# Patient Record
Sex: Male | Born: 1975 | Race: Black or African American | Hispanic: No | Marital: Single | Smoking: Never smoker
Health system: Southern US, Community
[De-identification: ages and names within clinical notes are randomized; demographics above are authoritative.]

## PROBLEM LIST (undated history)

## (undated) DIAGNOSIS — M545 Low back pain, unspecified: Secondary | ICD-10-CM

## (undated) DIAGNOSIS — G4730 Sleep apnea, unspecified: Secondary | ICD-10-CM

## (undated) DIAGNOSIS — R7303 Prediabetes: Secondary | ICD-10-CM

## (undated) DIAGNOSIS — E119 Type 2 diabetes mellitus without complications: Secondary | ICD-10-CM

## (undated) DIAGNOSIS — I10 Essential (primary) hypertension: Secondary | ICD-10-CM

## (undated) HISTORY — PX: WISDOM TOOTH EXTRACTION: SHX21

---

## 1997-11-01 ENCOUNTER — Encounter: Admission: RE | Admit: 1997-11-01 | Discharge: 1997-11-01 | Payer: Self-pay | Admitting: *Deleted

## 1998-07-14 ENCOUNTER — Encounter: Admission: RE | Admit: 1998-07-14 | Discharge: 1998-07-14 | Payer: Self-pay | Admitting: *Deleted

## 2001-03-09 ENCOUNTER — Emergency Department (HOSPITAL_COMMUNITY): Admission: EM | Admit: 2001-03-09 | Discharge: 2001-03-09 | Payer: Self-pay | Admitting: Emergency Medicine

## 2006-05-17 ENCOUNTER — Emergency Department (HOSPITAL_COMMUNITY): Admission: EM | Admit: 2006-05-17 | Discharge: 2006-05-17 | Payer: Self-pay | Admitting: Emergency Medicine

## 2006-06-30 ENCOUNTER — Encounter (HOSPITAL_COMMUNITY): Admission: RE | Admit: 2006-06-30 | Discharge: 2006-09-15 | Payer: Self-pay | Admitting: Cardiology

## 2014-05-16 ENCOUNTER — Ambulatory Visit: Payer: Self-pay | Admitting: Dietician

## 2019-05-02 ENCOUNTER — Encounter (HOSPITAL_COMMUNITY): Payer: Self-pay

## 2019-05-02 ENCOUNTER — Other Ambulatory Visit: Payer: Self-pay

## 2019-05-02 ENCOUNTER — Ambulatory Visit (HOSPITAL_COMMUNITY)
Admission: EM | Admit: 2019-05-02 | Discharge: 2019-05-02 | Disposition: A | Discharge status: Home / Self Care | Payer: Self-pay | Attending: Family Medicine | Admitting: Family Medicine

## 2019-05-02 DIAGNOSIS — I10 Essential (primary) hypertension: Secondary | ICD-10-CM | POA: Diagnosis present

## 2019-05-02 DIAGNOSIS — R609 Edema, unspecified: Secondary | ICD-10-CM

## 2019-05-02 DIAGNOSIS — R600 Localized edema: Secondary | ICD-10-CM | POA: Diagnosis present

## 2019-05-02 HISTORY — DX: Type 2 diabetes mellitus without complications: E11.9

## 2019-05-02 HISTORY — DX: Essential (primary) hypertension: I10

## 2019-05-02 MED ORDER — METFORMIN HCL 500 MG PO TABS: 500.0000 mg | ORAL_TABLET | Freq: Two times a day (BID) | ORAL | 1 refills | Status: DC

## 2019-05-02 MED ORDER — LOSARTAN POTASSIUM-HCTZ 100-25 MG PO TABS: 1.0000 | ORAL_TABLET | Freq: Every day | ORAL | 1 refills | Status: DC

## 2019-05-02 MED ORDER — FUROSEMIDE 40 MG PO TABS: 40.0000 mg | ORAL_TABLET | Freq: Every day | ORAL | 1 refills | Status: DC

## 2019-05-02 MED ORDER — POTASSIUM CHLORIDE CRYS ER 10 MEQ PO TBCR: 10.0000 meq | EXTENDED_RELEASE_TABLET | Freq: Two times a day (BID) | ORAL | 1 refills | Status: DC

## 2019-05-02 NOTE — ED Provider Notes (Signed)
[facility]    CSN: [id number] Arrival date & time: 05/02/19  1604      History   Chief Complaint Chief Complaint  Patient presents with  . Leg Swelling  . Rapid Heart Rate    HPI Tyler Carroll is a 43 y.o. male.   Tyler Carroll is here with history of hypertension and dependent edema as well as rapid heart rate when he moves around.  He is morbidly obese.  Did have primary care physician who has retired per history.  Blood pressure had been maintained on losartan hydrochlorothiazide as well as Lasix.  He also is a diabetic and takes Metformin 500 twice daily.  HPI  Past Medical History:  Diagnosis Date  . Diabetes mellitus without complication (HCC)   . Hypertension     Patient Active Problem List   Diagnosis Date Noted  . Peripheral edema 05/02/2019  . Hypertension 05/02/2019    History reviewed. No pertinent surgical history.     Home Medications    Prior to Admission medications   Medication Sig Start Date End Date Taking? Authorizing Provider  furosemide (LASIX) 40 MG tablet Take 1 tablet (40 mg total) by mouth daily. 05/02/19   Frederica Kuster, MD  losartan-hydrochlorothiazide (HYZAAR) 100-25 MG tablet Take 1 tablet by mouth daily. 05/02/19   Frederica Kuster, MD  metFORMIN (GLUCOPHAGE) 500 MG tablet Take 1 tablet (500 mg total) by mouth 2 (two) times daily with a meal. 05/02/19   Frederica Kuster, MD  potassium chloride (KLOR-CON) 10 MEQ tablet Take 1 tablet (10 mEq total) by mouth 2 (two) times daily. 05/02/19   Frederica Kuster, MD    Family History Family History  Family history unknown: Yes    Social History Social History   Tobacco Use  . Smoking status: Never Smoker  . Smokeless tobacco: Never Used  Substance Use Topics  . Alcohol use: Not on file  . Drug use: Not on file     Allergies   Patient has no known allergies.   Review of Systems Review of Systems  Constitutional: Positive for activity change and fatigue.   Cardiovascular: Positive for palpitations and leg swelling.  All other systems reviewed and are negative.    Physical Exam Triage Vital Signs ED Triage Vitals  Enc Vitals Group     BP 05/02/19 1701 (!) 178/83     Pulse Rate 05/02/19 1701 86     Resp 05/02/19 1701 16     Temp 05/02/19 1701 98.6 F (37 C)     Temp Source 05/02/19 1701 Oral     SpO2 05/02/19 1701 96 %     Weight --      Height --      Head Circumference --      Peak Flow --      Pain Score 05/02/19 1702 0     Pain Loc --      Pain Edu? --      Excl. in GC? --    No data found.  Updated Vital Signs BP (!) 178/83 (BP Location: Right Arm)   Pulse 86   Temp 98.6 F (37 C) (Oral)   Resp 16   SpO2 96%   Visual Acuity Right Eye Distance:   Left Eye Distance:   Bilateral Distance:    Right Eye Near:   Left Eye Near:    Bilateral Near:     Physical Exam Vitals signs and nursing note reviewed.  Constitutional:  Appearance: Normal appearance. He is obese.  Cardiovascular:     Rate and Rhythm: Normal rate and regular rhythm.  Pulmonary:     Effort: Pulmonary effort is normal.     Breath sounds: Normal breath sounds.  Musculoskeletal:     Right lower leg: Edema present.     Left lower leg: Edema present.  Neurological:     General: No focal deficit present.     Mental Status: He is alert and oriented to person, place, and time.      UC Treatments / Results  Labs (all labs ordered are listed, but only abnormal results are displayed) Labs Reviewed - No data to display  EKG   Radiology No results found.  Procedures Procedures (including critical care time)  Medications Ordered in UC Medications - No data to display  Initial Impression / Assessment and Plan / UC Course  I have reviewed the triage vital signs and the nursing notes.  Pertinent labs & imaging results that were available during my care of the patient were reviewed by me and considered in my medical decision making (see  chart for details).     Hypertension.  Dependent edema.  Refilled medicines that he had previously been on but out of for 1 week and encouraged contact with primary care physician Final Clinical Impressions(s) / UC Diagnoses   Final diagnoses:  Peripheral edema  Hypertension, unspecified type   Discharge Instructions   None    ED Prescriptions    Medication Sig Dispense Auth. Provider   furosemide (LASIX) 40 MG tablet Take 1 tablet (40 mg total) by mouth daily. 30 tablet Wardell Honour, MD   losartan-hydrochlorothiazide (HYZAAR) 100-25 MG tablet Take 1 tablet by mouth daily. 31 tablet Wardell Honour, MD   metFORMIN (GLUCOPHAGE) 500 MG tablet Take 1 tablet (500 mg total) by mouth 2 (two) times daily with a meal. 60 tablet Wardell Honour, MD   potassium chloride (KLOR-CON) 10 MEQ tablet Take 1 tablet (10 mEq total) by mouth 2 (two) times daily. 30 tablet Wardell Honour, MD     PDMP not reviewed this encounter.   Wardell Honour, MD 05/02/19 [id number]

## 2019-05-02 NOTE — ED Triage Notes (Signed)
Pt states he ran out of blood pressure medication a week ago and needs refills.

## 2019-05-02 NOTE — ED Triage Notes (Signed)
Pt presents with bilateral leg swelling with no pain X 2 days; pt also has rapid heart rate with exertion.

## 2019-05-02 NOTE — ED Notes (Signed)
Patient assessed in lobby, HR 103 with ambulation, decreased to 93 after sitting. Pt in NAD at this time, will reassess during triage when it is the pt's turn.

## 2019-07-10 ENCOUNTER — Other Ambulatory Visit: Payer: Self-pay

## 2019-07-10 ENCOUNTER — Ambulatory Visit (HOSPITAL_COMMUNITY)
Admission: EM | Admit: 2019-07-10 | Discharge: 2019-07-10 | Disposition: A | Discharge status: Home / Self Care | Payer: Self-pay | Attending: Emergency Medicine | Admitting: Emergency Medicine

## 2019-07-10 ENCOUNTER — Encounter (HOSPITAL_COMMUNITY): Payer: Self-pay | Admitting: Emergency Medicine

## 2019-07-10 DIAGNOSIS — E119 Type 2 diabetes mellitus without complications: Secondary | ICD-10-CM | POA: Insufficient documentation

## 2019-07-10 DIAGNOSIS — I10 Essential (primary) hypertension: Secondary | ICD-10-CM | POA: Insufficient documentation

## 2019-07-10 DIAGNOSIS — R609 Edema, unspecified: Secondary | ICD-10-CM | POA: Insufficient documentation

## 2019-07-10 LAB — BASIC METABOLIC PANEL
Anion gap: 9 (ref 5–15)
BUN: 11 mg/dL (ref 6–20)
CO2: 27 mmol/L (ref 22–32)
Calcium: 9.2 mg/dL (ref 8.9–10.3)
Chloride: 106 mmol/L (ref 98–111)
Creatinine, Ser: 0.87 mg/dL (ref 0.61–1.24)
GFR calc Af Amer: >60 mL/min (ref >60)
GFR calc non Af Amer: >60 mL/min (ref >60)
Glucose, Bld: 97 mg/dL (ref 70–99)
Potassium: 3.4 mmol/L — ABNORMAL LOW (ref 3.5–5.1)
Sodium: 142 mmol/L (ref 135–145)

## 2019-07-10 MED ORDER — FUROSEMIDE 40 MG PO TABS: 40.0000 mg | ORAL_TABLET | Freq: Every day | ORAL | 0 refills | Status: DC

## 2019-07-10 MED ORDER — LOSARTAN POTASSIUM-HCTZ 100-25 MG PO TABS: 1.0000 | ORAL_TABLET | Freq: Every day | ORAL | 0 refills | Status: DC

## 2019-07-10 MED ORDER — IBUPROFEN 800 MG PO TABS: 800.0000 mg | ORAL_TABLET | Freq: Three times a day (TID) | ORAL | 0 refills | Status: DC | PRN

## 2019-07-10 MED ORDER — POTASSIUM CHLORIDE CRYS ER 10 MEQ PO TBCR: 10.0000 meq | EXTENDED_RELEASE_TABLET | Freq: Two times a day (BID) | ORAL | 0 refills | Status: DC

## 2019-07-10 MED ORDER — METFORMIN HCL 500 MG PO TABS: 500.0000 mg | ORAL_TABLET | Freq: Two times a day (BID) | ORAL | 0 refills | Status: DC

## 2019-07-10 NOTE — ED Triage Notes (Signed)
Pt states his PCP retired, states he does not have insurance and its been difficult to get in to see someone. Has been taking his maintenance medicines. Pt c/o R leg swelling off and on. Also requesting medication refills. Denies pain

## 2019-07-10 NOTE — Discharge Instructions (Signed)
I have provided you with 30 days of refills of your medications.  Please use compression stockings to help with your swelling, elevate your legs as able.  I will call you if there are any concerning findings with your labs.  Please establish with a primary care provider for long term management of your medications. I have provided you with options which will work with your financial situation so you can still be managed.

## 2019-07-11 NOTE — ED Provider Notes (Signed)
[facility]    CSN: [id number] Arrival date & time: 07/10/19  1449      History   Chief Complaint Chief Complaint  Patient presents with  . Leg Swelling  . Medication Refill    HPI Tyler Carroll is a 44 y.o. male.   Tyler Carroll presents with complaints of bilateral leg swelling, R>L, with requests for refills of his medications. His physician retired and so he has not yet established with a new PCP. He took his medications yesterday, last. He takes lasix daily which does help with his swelling. Swelling isn't necessarily worse than usual, without new pain, redness or warmth. No calf pain. No new cough, shortness of breath , orthopnea or chest pain . Uses a CPAP. Out of his lasix, metformin, hyzaar, potassium. Also states he intermittently uses ibuprofen for pain, feels that ibuprofen is most helpful with this and would like a refill of this as well.     ROS per HPI, negative if not otherwise mentioned.      Past Medical History:  Diagnosis Date  . Diabetes mellitus without complication (HCC)   . Hypertension     Patient Active Problem List   Diagnosis Date Noted  . Peripheral edema 05/02/2019  . Hypertension 05/02/2019    History reviewed. No pertinent surgical history.     Home Medications    Prior to Admission medications   Medication Sig Start Date End Date Taking? Authorizing Provider  furosemide (LASIX) 40 MG tablet Take 1 tablet (40 mg total) by mouth daily. 07/10/19 08/09/19  Georgetta Haber, NP  ibuprofen (ADVIL) 800 MG tablet Take 1 tablet (800 mg total) by mouth every 8 (eight) hours as needed. 07/10/19   Georgetta Haber, NP  losartan-hydrochlorothiazide (HYZAAR) 100-25 MG tablet Take 1 tablet by mouth daily. 07/10/19   Georgetta Haber, NP  metFORMIN (GLUCOPHAGE) 500 MG tablet Take 1 tablet (500 mg total) by mouth 2 (two) times daily with a meal. 07/10/19   Zacherie Honeyman, Barron Alvine, NP  potassium chloride (KLOR-CON) 10 MEQ tablet Take 1 tablet (10  mEq total) by mouth 2 (two) times daily. 07/10/19   Georgetta Haber, NP    Family History Family History  Family history unknown: Yes    Social History Social History   Tobacco Use  . Smoking status: Never Smoker  . Smokeless tobacco: Never Used  Substance Use Topics  . Alcohol use: Not on file  . Drug use: Not on file     Allergies   Patient has no known allergies.   Review of Systems Review of Systems   Physical Exam Triage Vital Signs ED Triage Vitals  Enc Vitals Group     BP 07/10/19 1555 (!) 173/90     Pulse Rate 07/10/19 1555 91     Resp 07/10/19 1555 18     Temp 07/10/19 1555 99 F (37.2 C)     Temp Source 07/10/19 1555 Oral     SpO2 07/10/19 1555 95 %     Weight --      Height --      Head Circumference --      Peak Flow --      Pain Score 07/10/19 1558 0     Pain Loc --      Pain Edu? --      Excl. in GC? --    No data found.  Updated Vital Signs BP (!) 173/90 (BP Location: Left Arm)   Pulse 91  Temp 99 F (37.2 C) (Oral)   Resp 18   SpO2 95%    Physical Exam Constitutional:      Appearance: He is well-developed.  Cardiovascular:     Rate and Rhythm: Normal rate.     Comments: No redness, warmth or tenderness to lower legs with mild increased swelling to RLE  Pulmonary:     Effort: Pulmonary effort is normal.  Musculoskeletal:     Right lower leg: 2+ Edema present.     Left lower leg: 1+ Edema present.  Skin:    General: Skin is warm and dry.  Neurological:     Mental Status: He is alert and oriented to person, place, and time.      UC Treatments / Results  Labs (all labs ordered are listed, but only abnormal results are displayed) Labs Reviewed  BASIC METABOLIC PANEL - Abnormal; Notable for the following components:      Result Value   Potassium 3.4 (*)    All other components within normal limits    EKG   Radiology No results found.  Procedures Procedures (including critical care time)  Medications Ordered in  UC Medications - No data to display  Initial Impression / Assessment and Plan / UC Course  I have reviewed the triage vital signs and the nursing notes.  Pertinent labs & imaging results that were available during my care of the patient were reviewed by me and considered in my medical decision making (see chart for details).     Medications refilled today, hypertension and lower extremity edema noted, meds taken last yesterday. Potassium refilled with noted K of 3.4 today. To continue with daily potassium. Emphasized establish with a PCP for recheck and monitoring of his labs and status. Return precautions provided. Ambulatory out of clinic without difficulty.    Final Clinical Impressions(s) / UC Diagnoses   Final diagnoses:  Peripheral edema  Hypertension, unspecified type  Type 2 diabetes mellitus without complication, without long-term current use of insulin ([place])     Discharge Instructions     I have provided you with 30 days of refills of your medications.  Please use compression stockings to help with your swelling, elevate your legs as able.  I will call you if there are any concerning findings with your labs.  Please establish with a primary care provider for long term management of your medications. I have provided you with options which will work with your financial situation so you can still be managed.    ED Prescriptions    Medication Sig Dispense Auth. Provider   furosemide (LASIX) 40 MG tablet Take 1 tablet (40 mg total) by mouth daily. 30 tablet Augusto Gamble B, NP   losartan-hydrochlorothiazide (HYZAAR) 100-25 MG tablet Take 1 tablet by mouth daily. 30 tablet Augusto Gamble B, NP   metFORMIN (GLUCOPHAGE) 500 MG tablet Take 1 tablet (500 mg total) by mouth 2 (two) times daily with a meal. 60 tablet Kirt Chew B, NP   potassium chloride (KLOR-CON) 10 MEQ tablet Take 1 tablet (10 mEq total) by mouth 2 (two) times daily. 60 tablet Augusto Gamble B, NP   ibuprofen  (ADVIL) 800 MG tablet Take 1 tablet (800 mg total) by mouth every 8 (eight) hours as needed. 21 tablet Zigmund Gottron, NP     PDMP not reviewed this encounter.   Zigmund Gottron, NP 07/11/19 1104

## 2019-07-15 ENCOUNTER — Telehealth (HOSPITAL_COMMUNITY): Payer: Self-pay | Admitting: Emergency Medicine

## 2019-07-15 NOTE — Telephone Encounter (Signed)
Attempted to reach pt to discuss labs and medicine, no answer.

## 2019-09-03 ENCOUNTER — Encounter (HOSPITAL_COMMUNITY): Payer: Self-pay

## 2019-09-03 ENCOUNTER — Other Ambulatory Visit: Payer: Self-pay

## 2019-09-03 ENCOUNTER — Ambulatory Visit (HOSPITAL_COMMUNITY)
Admission: EM | Admit: 2019-09-03 | Discharge: 2019-09-03 | Disposition: A | Discharge status: Home / Self Care | Payer: Medicaid Other

## 2019-09-03 DIAGNOSIS — S61212A Laceration without foreign body of right middle finger without damage to nail, initial encounter: Secondary | ICD-10-CM | POA: Insufficient documentation

## 2019-09-03 DIAGNOSIS — Z760 Encounter for issue of repeat prescription: Secondary | ICD-10-CM | POA: Insufficient documentation

## 2019-09-03 DIAGNOSIS — L03011 Cellulitis of right finger: Secondary | ICD-10-CM | POA: Insufficient documentation

## 2019-09-03 DIAGNOSIS — I10 Essential (primary) hypertension: Secondary | ICD-10-CM | POA: Insufficient documentation

## 2019-09-03 LAB — BASIC METABOLIC PANEL
Anion gap: 10 (ref 5–15)
BUN: 9 mg/dL (ref 6–20)
CO2: 25 mmol/L (ref 22–32)
Calcium: 9 mg/dL (ref 8.9–10.3)
Chloride: 103 mmol/L (ref 98–111)
Creatinine, Ser: 0.79 mg/dL (ref 0.61–1.24)
GFR calc Af Amer: >60 mL/min (ref >60)
GFR calc non Af Amer: >60 mL/min (ref >60)
Glucose, Bld: 99 mg/dL (ref 70–99)
Potassium: 4.1 mmol/L (ref 3.5–5.1)
Sodium: 138 mmol/L (ref 135–145)

## 2019-09-03 MED ORDER — CEPHALEXIN 500 MG PO CAPS: 500.0000 mg | ORAL_CAPSULE | Freq: Four times a day (QID) | ORAL | 0 refills | Status: AC

## 2019-09-03 MED ORDER — FUROSEMIDE 40 MG PO TABS: 40.0000 mg | ORAL_TABLET | Freq: Every day | ORAL | 0 refills | Status: DC

## 2019-09-03 MED ORDER — METFORMIN HCL 500 MG PO TABS: 500.0000 mg | ORAL_TABLET | Freq: Two times a day (BID) | ORAL | 0 refills | Status: DC

## 2019-09-03 MED ORDER — LOSARTAN POTASSIUM-HCTZ 100-25 MG PO TABS: 1.0000 | ORAL_TABLET | Freq: Every day | ORAL | 0 refills | Status: DC

## 2019-09-03 MED ORDER — POTASSIUM CHLORIDE CRYS ER 10 MEQ PO TBCR: 10.0000 meq | EXTENDED_RELEASE_TABLET | Freq: Two times a day (BID) | ORAL | 0 refills | Status: DC

## 2019-09-03 NOTE — ED Provider Notes (Signed)
[place]    CSN: [id number] Arrival date & time: 09/03/19  1133      History   Chief Complaint Chief Complaint  Patient presents with  . Laceration  . Medication Refill    HPI Deloris Mittag is a 44 y.o. male history of hypertension, DM type II, peripheral edema presenting today for evaluation of laceration to finger as well as medication refill.  Patient sustained laceration to his right middle finger around the nailbed approximately 3 days ago.  Occurred while trying to use a caulk gun.  He is concerned as of recently he has noticed an area turning white and he is concerned about infection.  Has had increased pain of recently as well.  Patient also is requesting refills of losartan, Lasix, Metformin and potassium.  Has 1 day left of medicines.  Has plans to follow-up with PCP later this month, is currently in between jobs and is hoping to regain health insurance with his new job.  HPI  Past Medical History:  Diagnosis Date  . Diabetes mellitus without complication ([place])   . Hypertension     Patient Active Problem List   Diagnosis Date Noted  . Peripheral edema 05/02/2019  . Hypertension 05/02/2019    History reviewed. No pertinent surgical history.     Home Medications    Prior to Admission medications   Medication Sig Start Date End Date Taking? Authorizing Provider  Blood Pressure Monitoring (BLOOD PRESSURE KIT) KIT Check BP as needed. 09/18/18  Yes [provider]  rosuvastatin (CRESTOR) 10 MG tablet Take by mouth. 12/27/18  Yes [provider]  potassium chloride (KLOR-CON) 10 MEQ tablet Take by mouth. 12/27/18 09/03/19 Yes [provider]  cephALEXin (KEFLEX) 500 MG capsule Take 1 capsule (500 mg total) by mouth 4 (four) times daily for 7 days. 09/03/19 09/10/19  Tyreek Clabo C, PA-C  furosemide (LASIX) 40 MG tablet Take 1 tablet (40 mg total) by mouth daily. 09/03/19 10/03/19  Gino Garrabrant C, PA-C  ibuprofen (ADVIL) 800 MG  tablet Take 1 tablet (800 mg total) by mouth every 8 (eight) hours as needed. 07/10/19   Zigmund Gottron, NP  losartan-hydrochlorothiazide (HYZAAR) 100-25 MG tablet Take 1 tablet by mouth daily. 09/03/19   Orissa Arreaga C, PA-C  metFORMIN (GLUCOPHAGE) 500 MG tablet Take 1 tablet (500 mg total) by mouth 2 (two) times daily with a meal. 09/03/19   Emileo Semel C, PA-C  potassium chloride (KLOR-CON) 10 MEQ tablet Take 1 tablet (10 mEq total) by mouth 2 (two) times daily. 09/03/19   Kairee Isa, Elesa Hacker, PA-C    Family History Family History  Problem Relation Age of Onset  . Hypertension Mother   . Heart failure Father     Social History Social History   Tobacco Use  . Smoking status: Never Smoker  . Smokeless tobacco: Never Used  Substance Use Topics  . Alcohol use: Not Currently  . Drug use: Never     Allergies   Patient has no known allergies.   Review of Systems Review of Systems  Constitutional: Negative for fatigue and fever.  HENT: Negative for congestion, sinus pressure and sore throat.   Eyes: Negative for photophobia, pain, redness, itching and visual disturbance.  Respiratory: Negative for cough and shortness of breath.   Cardiovascular: Negative for chest pain.  Gastrointestinal: Negative for abdominal pain, nausea and vomiting.  Genitourinary: Negative for decreased urine volume and hematuria.  Musculoskeletal: Positive for arthralgias. Negative for myalgias, neck pain and  neck stiffness.  Skin: Positive for color change and wound. Negative for rash.  Neurological: Negative for dizziness, syncope, speech difficulty, weakness, light-headedness, numbness and headaches.     Physical Exam Triage Vital Signs ED Triage Vitals  Enc Vitals Group     BP 09/03/19 1229 (!) 162/72     Pulse Rate 09/03/19 1229 86     Resp 09/03/19 1229 (!) 21     Temp 09/03/19 1229 98.5 F (36.9 C)     Temp Source 09/03/19 1229 Oral     SpO2 09/03/19 1229 100 %     Weight 09/03/19 1226  (!) 425 lb (192.8 kg)     Height --      Head Circumference --      Peak Flow --      Pain Score 09/03/19 1226 6     Pain Loc --      Pain Edu? --      Excl. in [place]? --    No data found.  Updated Vital Signs BP (!) 162/72 (BP Location: Right Arm)   Pulse 86   Temp 98.5 F (36.9 C) (Oral)   Resp (!) 21   Wt (!) 425 lb (192.8 kg)   SpO2 100%   Visual Acuity Right Eye Distance:   Left Eye Distance:   Bilateral Distance:    Right Eye Near:   Left Eye Near:    Bilateral Near:     Physical Exam Vitals and nursing note reviewed.  Constitutional:      Appearance: He is well-developed.     Comments: No acute distress  HENT:     Head: Normocephalic and atraumatic.     Nose: Nose normal.  Eyes:     Extraocular Movements: Extraocular movements intact.     Conjunctiva/sclera: Conjunctivae normal.     Pupils: Pupils are equal, round, and reactive to light.  Cardiovascular:     Rate and Rhythm: Normal rate.  Pulmonary:     Effort: Pulmonary effort is normal. No respiratory distress.     Comments:   Abdominal:     General: There is no distension.  Musculoskeletal:        General: Normal range of motion.     Cervical back: Neck supple.  Skin:    General: Skin is warm and dry.     Comments: Right middle finger with superficial abrasion noted to lateral nail fold, small area of white discoloration, tender to palpation over this area, does not extend distal finger pulp  Neurological:     Mental Status: He is alert and oriented to person, place, and time.      UC Treatments / Results  Labs (all labs ordered are listed, but only abnormal results are displayed) Labs Reviewed  BASIC METABOLIC PANEL    EKG   Radiology No results found.  Procedures Procedures (including critical care time)  Attempted I&D per patient request.  Cleaned area with Betadine and allowed to dry.  Inserted 11 blade scalpel into area of white discoloration, no drainage obtained.  Wound  wrapped with bandage.  No immediate complications.  Medications Ordered in UC Medications - No data to display  Initial Impression / Assessment and Plan / UC Course  I have reviewed the triage vital signs and the nursing notes.  Pertinent labs & imaging results that were available during my care of the patient were reviewed by me and considered in my medical decision making (see chart for details).     Superficial  abrasion, will go ahead and empirically place on Keflex and continue to monitor for gradual healing.  Keep clean and dry.  Refilling blood pressure medicine, Lasix, Metformin and potassium.  Has not had recent blood work, will recheck BMP to ensure potassium and kidney function within normal limits.  Follow-up with PCP for further refills and management of blood pressure/diabetes/edema.  Discussed strict return precautions. Patient verbalized understanding and is agreeable with plan.  Final Clinical Impressions(s) / UC Diagnoses   Final diagnoses:  Laceration of right middle finger without foreign body without damage to nail, initial encounter  Paronychia of right middle finger  Medication refill  Essential hypertension     Discharge Instructions     Please continue to keep wound clean and dry, begin Keflex 4 times daily for the next week Ensure wound gradually healing and pain improving over the next week  I have refilled your Hyzaar, Lasix, Metformin and potassium.  Continue to take as prescribed. I am checking your electrolytes and kidney function, I will call if anything looks concerning in regards to continuing your medicines Please continue to establish care with primary care for further refills and management of hypertension, diabetes   ED Prescriptions    Medication Sig Dispense Auth. Provider   cephALEXin (KEFLEX) 500 MG capsule Take 1 capsule (500 mg total) by mouth 4 (four) times daily for 7 days. 28 capsule Palmira Stickle C, PA-C   furosemide (LASIX) 40  MG tablet Take 1 tablet (40 mg total) by mouth daily. 30 tablet Kurtiss Wence C, PA-C   losartan-hydrochlorothiazide (HYZAAR) 100-25 MG tablet Take 1 tablet by mouth daily. 30 tablet Kamelia Lampkins C, PA-C   metFORMIN (GLUCOPHAGE) 500 MG tablet Take 1 tablet (500 mg total) by mouth 2 (two) times daily with a meal. 60 tablet Rechelle Niebla C, PA-C   potassium chloride (KLOR-CON) 10 MEQ tablet Take 1 tablet (10 mEq total) by mouth 2 (two) times daily. 60 tablet Nykeem Citro, [place] C, PA-C     PDMP not reviewed this encounter.   Janith Lima, [place] 09/03/19 1412

## 2019-09-03 NOTE — Discharge Instructions (Signed)
Please continue to keep wound clean and dry, begin Keflex 4 times daily for the next week Ensure wound gradually healing and pain improving over the next week  I have refilled your Hyzaar, Lasix, Metformin and potassium.  Continue to take as prescribed. I am checking your electrolytes and kidney function, I will call if anything looks concerning in regards to continuing your medicines Please continue to establish care with primary care for further refills and management of hypertension, diabetes

## 2019-09-03 NOTE — ED Triage Notes (Signed)
Pt is here needing a medication refill of Losartan 100-25MG , Lasix 40MG , Metformin 500MG , Klor-Con-10MEG he has 1 day left. Pt also has a right hand finger #3(middle) laceration that happened 3 days ago.

## 2019-10-24 ENCOUNTER — Ambulatory Visit (HOSPITAL_COMMUNITY)
Admission: EM | Admit: 2019-10-24 | Discharge: 2019-10-24 | Disposition: A | Discharge status: Home / Self Care | Payer: Medicaid Other | Attending: Family Medicine | Admitting: Family Medicine

## 2019-10-24 ENCOUNTER — Other Ambulatory Visit: Payer: Self-pay

## 2019-10-24 ENCOUNTER — Encounter (HOSPITAL_COMMUNITY): Payer: Self-pay

## 2019-10-24 DIAGNOSIS — Z760 Encounter for issue of repeat prescription: Secondary | ICD-10-CM

## 2019-10-24 DIAGNOSIS — L89511 Pressure ulcer of right ankle, stage 1: Secondary | ICD-10-CM

## 2019-10-24 DIAGNOSIS — R600 Localized edema: Secondary | ICD-10-CM

## 2019-10-24 MED ORDER — POTASSIUM CHLORIDE CRYS ER 10 MEQ PO TBCR: 10.0000 meq | EXTENDED_RELEASE_TABLET | Freq: Two times a day (BID) | ORAL | 1 refills | Status: DC

## 2019-10-24 MED ORDER — METFORMIN HCL 500 MG PO TABS: 500.0000 mg | ORAL_TABLET | Freq: Two times a day (BID) | ORAL | 1 refills | Status: DC

## 2019-10-24 MED ORDER — BACITRACIN ZINC 500 UNIT/GM EX OINT
TOPICAL_OINTMENT | CUTANEOUS | Status: AC
  Filled 2019-10-24: qty 28.35

## 2019-10-24 MED ORDER — FUROSEMIDE 40 MG PO TABS: 40.0000 mg | ORAL_TABLET | Freq: Every day | ORAL | 1 refills | Status: DC

## 2019-10-24 MED ORDER — LOSARTAN POTASSIUM-HCTZ 100-25 MG PO TABS: 1.0000 | ORAL_TABLET | Freq: Every day | ORAL | 1 refills | Status: DC

## 2019-10-24 MED ORDER — ROSUVASTATIN CALCIUM 10 MG PO TABS: 10.0000 mg | ORAL_TABLET | Freq: Every day | ORAL | 1 refills | Status: DC

## 2019-10-24 MED ORDER — MUPIROCIN 2 % EX OINT: TOPICAL_OINTMENT | CUTANEOUS | 0 refills | Status: DC

## 2019-10-24 MED ORDER — BACITRACIN ZINC 500 UNIT/GM EX OINT
TOPICAL_OINTMENT | CUTANEOUS | Status: AC
Start: 2019-10-24 — End: ?
  Filled 2019-10-24: qty 0.9

## 2019-10-24 NOTE — ED Triage Notes (Signed)
Pt presents with sore on right ankle that he believes is not healing properly.

## 2019-10-24 NOTE — ED Provider Notes (Signed)
[place]    CSN: [id number] Arrival date & time: 10/24/19  1742      History   Chief Complaint Chief Complaint  Patient presents with  . Sore on Ankle    HPI Tyler Carroll is a 44 y.o. male.   HPI  Patient has problems with chronic pedal edema He takes a diuretic.  He elevates his feet.  He watches his salt.  He tries to wear compression stockings. He states that he had some rubbing on the front of his ankle that caused a blister and now a small ulcer.  The wound is been present for about a week.  He would like to have this looked at. He does not have diabetes.  He is on Metformin for "prediabetes". He does have hypertension.  He states he is compliant with his medicine.  His blood pressure is mildly elevated today. Patient is morbidly obese He does have a primary care doctor but his appointment is not until June or July.  He needs refills of his medicines until he can be seen  Past Medical History:  Diagnosis Date  . Diabetes mellitus without complication ([place])   . Hypertension     Patient Active Problem List   Diagnosis Date Noted  . Peripheral edema 05/02/2019  . Hypertension 05/02/2019    History reviewed. No pertinent surgical history.     Home Medications    Prior to Admission medications   Medication Sig Start Date End Date Taking? Authorizing Provider  Blood Pressure Monitoring (BLOOD PRESSURE KIT) KIT Check BP as needed. 09/18/18   [provider]  furosemide (LASIX) 40 MG tablet Take 1 tablet (40 mg total) by mouth daily. 10/24/19 11/23/19  Raylene Everts, MD  ibuprofen (ADVIL) 800 MG tablet Take 1 tablet (800 mg total) by mouth every 8 (eight) hours as needed. 07/10/19   Zigmund Gottron, NP  losartan-hydrochlorothiazide (HYZAAR) 100-25 MG tablet Take 1 tablet by mouth daily. 10/24/19   Raylene Everts, MD  metFORMIN (GLUCOPHAGE) 500 MG tablet Take 1 tablet (500 mg total) by mouth 2 (two) times daily with a meal. 10/24/19    Raylene Everts, MD  mupirocin ointment Drue Stager) 2 % Apply to rash two times a day 10/24/19   Raylene Everts, MD  potassium chloride (KLOR-CON) 10 MEQ tablet Take 1 tablet (10 mEq total) by mouth 2 (two) times daily. 10/24/19   Raylene Everts, MD  rosuvastatin (CRESTOR) 10 MG tablet Take 1 tablet (10 mg total) by mouth daily. 10/24/19   Raylene Everts, MD  potassium chloride (KLOR-CON) 10 MEQ tablet Take by mouth. 12/27/18 09/03/19  [provider]    Family History Family History  Problem Relation Age of Onset  . Hypertension Mother   . Heart failure Father     Social History Social History   Tobacco Use  . Smoking status: Never Smoker  . Smokeless tobacco: Never Used  Substance Use Topics  . Alcohol use: Not Currently  . Drug use: Never     Allergies   Patient has no known allergies.   Review of Systems Review of Systems  Cardiovascular: Positive for leg swelling.  Skin: Positive for wound.     Physical Exam Triage Vital Signs ED Triage Vitals  Enc Vitals Group     BP 10/24/19 1841 (!) 158/94     Pulse Rate 10/24/19 1841 80     Resp 10/24/19 1841 18     Temp 10/24/19 1841 98.4  F (36.9 C)     Temp Source 10/24/19 1841 Oral     SpO2 10/24/19 1841 100 %     Weight --      Height --      Head Circumference --      Peak Flow --      Pain Score 10/24/19 1839 3     Pain Loc --      Pain Edu? --      Excl. in [place]? --    No data found.  Updated Vital Signs BP (!) 158/94 (BP Location: Right Arm)   Pulse 80   Temp 98.4 F (36.9 C) (Oral)   Resp 18   SpO2 100%      Physical Exam Constitutional:      General: He is not in acute distress.    Appearance: He is well-developed. He is obese.     Comments: Super obese  HENT:     Head: Normocephalic and atraumatic.     Mouth/Throat:     Comments: Mask is in place Eyes:     Conjunctiva/sclera: Conjunctivae normal.     Pupils: Pupils are equal, round, and reactive to light.    Cardiovascular:     Rate and Rhythm: Normal rate.  Pulmonary:     Effort: Pulmonary effort is normal. No respiratory distress.  Musculoskeletal:        General: Normal range of motion.     Cervical back: Normal range of motion.     Right lower leg: Edema present.     Left lower leg: Edema present.     Comments: Large pitting edema both ankles to knee  Skin:    General: Skin is warm and dry.     Comments: Anterior right ankle has a 2 cm oval-shaped superficial ulceration.  Partial-thickness.  No erythema or drainage to suggest infection  Neurological:     General: No focal deficit present.     Mental Status: He is alert.  Psychiatric:        Mood and Affect: Mood normal.        Behavior: Behavior normal.      UC Treatments / Results  Labs (all labs ordered are listed, but only abnormal results are displayed) Labs Reviewed - No data to display  EKG   Radiology No results found.  Procedures Procedures (including critical care time)  Medications Ordered in UC Medications - No data to display  Initial Impression / Assessment and Plan / UC Course  I have reviewed the triage vital signs and the nursing notes.  Pertinent labs & imaging results that were available during my care of the patient were reviewed by me and considered in my medical decision making (see chart for details).     Reviewed that these wounds take a long time to heal.  Managing edema as part of the process.  He needs to really try to get the swelling down in order for the wound to heal.  Also needs to be careful about his carbohydrates and taking the Metformin so his sugar does not get high.  I told him that diabetes will also impair wound healing.  He is not a cigarette smoker.  Discussed signs of infection.  Discussed wound care Final Clinical Impressions(s) / UC Diagnoses   Final diagnoses:  Pedal edema  Pressure injury of right ankle, stage 1  Encounter for medication refill     Discharge  Instructions     Clean area 2 times a  day and apply small amount of antibiotic ointment.  Cover with bandage Return as needed Medicines are refilled   ED Prescriptions    Medication Sig Dispense Auth. Provider   furosemide (LASIX) 40 MG tablet Take 1 tablet (40 mg total) by mouth daily. 30 tablet Raylene Everts, MD   losartan-hydrochlorothiazide [facility]) 100-25 MG tablet Take 1 tablet by mouth daily. 30 tablet Raylene Everts, MD   metFORMIN (GLUCOPHAGE) 500 MG tablet Take 1 tablet (500 mg total) by mouth 2 (two) times daily with a meal. 60 tablet Raylene Everts, MD   potassium chloride (KLOR-CON) 10 MEQ tablet Take 1 tablet (10 mEq total) by mouth 2 (two) times daily. 60 tablet Raylene Everts, MD   rosuvastatin (CRESTOR) 10 MG tablet Take 1 tablet (10 mg total) by mouth daily. 30 tablet Raylene Everts, MD   mupirocin ointment (BACTROBAN) 2 % Apply to rash two times a day 22 g Raylene Everts, MD     PDMP not reviewed this encounter.   Raylene Everts, MD 10/24/19 [phone]

## 2019-10-24 NOTE — Discharge Instructions (Signed)
Clean area 2 times a day and apply small amount of antibiotic ointment.  Cover with bandage Return as needed Medicines are refilled

## 2020-01-01 ENCOUNTER — Encounter (HOSPITAL_COMMUNITY): Payer: Self-pay

## 2020-01-01 ENCOUNTER — Ambulatory Visit (INDEPENDENT_AMBULATORY_CARE_PROVIDER_SITE_OTHER): Payer: Self-pay

## 2020-01-01 ENCOUNTER — Ambulatory Visit (HOSPITAL_COMMUNITY)
Admission: EM | Admit: 2020-01-01 | Discharge: 2020-01-01 | Disposition: A | Discharge status: Home / Self Care | Payer: Self-pay | Attending: Urgent Care | Admitting: Urgent Care

## 2020-01-01 ENCOUNTER — Other Ambulatory Visit: Payer: Self-pay

## 2020-01-01 DIAGNOSIS — L089 Local infection of the skin and subcutaneous tissue, unspecified: Secondary | ICD-10-CM

## 2020-01-01 DIAGNOSIS — M25442 Effusion, left hand: Secondary | ICD-10-CM

## 2020-01-01 DIAGNOSIS — T148XXA Other injury of unspecified body region, initial encounter: Secondary | ICD-10-CM

## 2020-01-01 DIAGNOSIS — M79645 Pain in left finger(s): Secondary | ICD-10-CM

## 2020-01-01 DIAGNOSIS — M25542 Pain in joints of left hand: Secondary | ICD-10-CM

## 2020-01-01 DIAGNOSIS — L03012 Cellulitis of left finger: Secondary | ICD-10-CM

## 2020-01-01 MED ORDER — FUROSEMIDE 40 MG PO TABS: 40.0000 mg | ORAL_TABLET | Freq: Every day | ORAL | 0 refills | Status: DC

## 2020-01-01 MED ORDER — KETOROLAC TROMETHAMINE 60 MG/2ML IM SOLN
60.0000 mg | Freq: Once | INTRAMUSCULAR | Status: AC
  Administered 2020-01-01: 60 mg via INTRAMUSCULAR

## 2020-01-01 MED ORDER — KETOROLAC TROMETHAMINE 60 MG/2ML IM SOLN
INTRAMUSCULAR | Status: AC
  Filled 2020-01-01: qty 2

## 2020-01-01 MED ORDER — DOXYCYCLINE HYCLATE 100 MG PO CAPS: 100.0000 mg | ORAL_CAPSULE | Freq: Two times a day (BID) | ORAL | 0 refills | Status: DC

## 2020-01-01 MED ORDER — LOSARTAN POTASSIUM 100 MG PO TABS
100.0000 mg | ORAL_TABLET | Freq: Every day | ORAL | 0 refills | Status: DC
Start: 2020-01-01 — End: 2021-10-23

## 2020-01-01 MED ORDER — NAPROXEN 500 MG PO TABS
500.0000 mg | ORAL_TABLET | Freq: Two times a day (BID) | ORAL | 0 refills | Status: DC
Start: 2020-01-01 — End: 2021-10-23

## 2020-01-01 NOTE — Progress Notes (Signed)
Orthopedic Tech Progress Note Patient Details:  Tyler Carroll 1975/07/03 [id number]  Ortho Devices Type of Ortho Device: Volar splint Ortho Device/Splint Location: Left Upper Extremity Ortho Device/Splint Interventions: Ordered, Application   Post Interventions Patient Tolerated: Well Instructions Provided: Adjustment of device, Care of device, Poper ambulation with device Applied volar splint from proximal wrist to DIP of third left digit per MD request.  Gerald Stabs 01/01/2020, 1:08 PM

## 2020-01-01 NOTE — Discharge Instructions (Signed)
Please start doxycycline today to help with your wound infection spreading into your hand.  Make sure you take naproxen for pain and inflammation.  Wear the splint of your hand until the hand specialist can see for recheck.  This is very important as this kind of infection can become severe enough that you need to have hand specialist to open up the wound to have drainage of the infection.  If you worsen over the next 48 hours, please report to the emergency room.`

## 2020-01-01 NOTE — ED Triage Notes (Signed)
Patient reports left hand swelling x3 days. Patient can't think of injury or trauma to the area.

## 2020-01-01 NOTE — ED Provider Notes (Signed)
[place]   MRN: [id number] DOB: September 21, 1975  Subjective:   Tyler Carroll is a 44 y.o. male presenting for 3 day hx of acute onset worsening left 3rd finger pain, extending into his left hand with swelling and redness.  Reports that he had had a slight trauma from trying to kill a bug, hit his head up against the wall for missing the bug.  Denies other fall or trauma.  He was doing a lot of extensive cleaning in the past few days.  Try to keep his wound covered.  Patient reports history of hypertension, is prediabetic as well.  Last blood sugar check was under 100.  No current facility-administered medications for this encounter.  Current Outpatient Medications:    Blood Pressure Monitoring (BLOOD PRESSURE KIT) KIT, Check BP as needed., Disp: , Rfl:    furosemide (LASIX) 40 MG tablet, Take 1 tablet (40 mg total) by mouth daily., Disp: 30 tablet, Rfl: 1   ibuprofen (ADVIL) 800 MG tablet, Take 1 tablet (800 mg total) by mouth every 8 (eight) hours as needed., Disp: 21 tablet, Rfl: 0   losartan-hydrochlorothiazide (HYZAAR) 100-25 MG tablet, Take 1 tablet by mouth daily., Disp: 30 tablet, Rfl: 1   metFORMIN (GLUCOPHAGE) 500 MG tablet, Take 1 tablet (500 mg total) by mouth 2 (two) times daily with a meal., Disp: 60 tablet, Rfl: 1   mupirocin ointment (BACTROBAN) 2 %, Apply to rash two times a day, Disp: 22 g, Rfl: 0   potassium chloride (KLOR-CON) 10 MEQ tablet, Take 1 tablet (10 mEq total) by mouth 2 (two) times daily., Disp: 60 tablet, Rfl: 1   rosuvastatin (CRESTOR) 10 MG tablet, Take 1 tablet (10 mg total) by mouth daily., Disp: 30 tablet, Rfl: 1   No Known Allergies  Past Medical History:  Diagnosis Date   Diabetes mellitus without complication ([place])    "pre-diabetic" 01/01/20   Hypertension      History reviewed. No pertinent surgical history.  Family History  Problem Relation Age of Onset   Hypertension Mother    Heart failure Father     Social History     Tobacco Use   Smoking status: Never Smoker   Smokeless tobacco: Never Used  Substance Use Topics   Alcohol use: Not Currently   Drug use: Never    ROS   Objective:   Vitals: BP (!) 134/67    Pulse 99    Temp 97.9 F (36.6 C)    Resp 18    SpO2 100%   Physical Exam Constitutional:      General: He is not in acute distress.    Appearance: Normal appearance. He is well-developed. He is obese. He is not ill-appearing, toxic-appearing or diaphoretic.  HENT:     Head: Normocephalic and atraumatic.     Right Ear: External ear normal.     Left Ear: External ear normal.     Nose: Nose normal.     Mouth/Throat:     Pharynx: Oropharynx is clear.  Eyes:     General: No scleral icterus.       Right eye: No discharge.        Left eye: No discharge.     Extraocular Movements: Extraocular movements intact.     Pupils: Pupils are equal, round, and reactive to light.  Cardiovascular:     Rate and Rhythm: Normal rate.  Pulmonary:     Effort: Pulmonary effort is normal.  Musculoskeletal:     Right hand: Swelling  and tenderness present. No deformity, lacerations or bony tenderness. Decreased range of motion. Normal strength. Normal sensation. There is no disruption of two-point discrimination. Normal capillary refill.       Hands:     Cervical back: Normal range of motion.  Skin:    General: Skin is warm and dry.  Neurological:     Mental Status: He is alert and oriented to person, place, and time.  Psychiatric:        Mood and Affect: Mood normal.        Behavior: Behavior normal.        Thought Content: Thought content normal.        Judgment: Judgment normal.     DG Hand Complete Left  Result Date: 01/01/2020 CLINICAL DATA:  Left hand pain and swelling for the past 3 days, primarily involving the third MCP joint. EXAM: LEFT HAND - COMPLETE 3+ VIEW COMPARISON:  None. FINDINGS: Apparent soft tissue swelling about the dorsal aspect of the hand. No fracture or dislocation  with special attention paid to the third MCP joint. No subcutaneous emphysema. No discrete areas of osteolysis to suggest osteomyelitis. There is a peripherally corticated ossicle adjacent to the base of the first metacarpal. Joint spaces are preserved. No erosions. IMPRESSION: Soft tissue swelling about the dorsal aspect of the hand without associated fracture or radiopaque foreign body. Electronically Signed   By: Sandi Mariscal M.D.   On: 01/01/2020 12:25    Assessment and Plan :   PDMP not reviewed this encounter.  1. Infected wound   2. Cellulitis of finger of left hand   3. Pain of left middle finger     Patient is to be placed in volar splint extending up to the DIP of the third left finger.  Start doxycycline for infectious process.  Naproxen for pain and inflammation.  Contact hand specialist ASAP tomorrow morning. Counseled patient on potential for adverse effects with medications prescribed/recommended today, ER and return-to-clinic precautions discussed, patient verbalized understanding.    Jaynee Eagles, PA-C 01/01/20 1247

## 2020-01-01 NOTE — ED Notes (Signed)
Ortho tech, Nadonna, for volar splint application.

## 2020-02-13 ENCOUNTER — Other Ambulatory Visit: Payer: Self-pay

## 2020-02-13 ENCOUNTER — Ambulatory Visit (HOSPITAL_COMMUNITY)
Admission: EM | Admit: 2020-02-13 | Discharge: 2020-02-13 | Disposition: A | Discharge status: Home / Self Care | Payer: Medicaid Other

## 2020-02-17 ENCOUNTER — Encounter (HOSPITAL_COMMUNITY): Payer: Self-pay

## 2020-02-17 ENCOUNTER — Ambulatory Visit (HOSPITAL_COMMUNITY)
Admission: EM | Admit: 2020-02-17 | Discharge: 2020-02-17 | Disposition: A | Discharge status: Home / Self Care | Payer: Managed Care, Other (non HMO) | Attending: Emergency Medicine | Admitting: Emergency Medicine

## 2020-02-17 ENCOUNTER — Other Ambulatory Visit: Payer: Self-pay

## 2020-02-17 DIAGNOSIS — J019 Acute sinusitis, unspecified: Secondary | ICD-10-CM | POA: Diagnosis not present

## 2020-02-17 DIAGNOSIS — Z79899 Other long term (current) drug therapy: Secondary | ICD-10-CM | POA: Insufficient documentation

## 2020-02-17 DIAGNOSIS — Z7984 Long term (current) use of oral hypoglycemic drugs: Secondary | ICD-10-CM | POA: Insufficient documentation

## 2020-02-17 DIAGNOSIS — Z7901 Long term (current) use of anticoagulants: Secondary | ICD-10-CM | POA: Insufficient documentation

## 2020-02-17 DIAGNOSIS — I10 Essential (primary) hypertension: Secondary | ICD-10-CM | POA: Insufficient documentation

## 2020-02-17 DIAGNOSIS — R05 Cough: Secondary | ICD-10-CM | POA: Insufficient documentation

## 2020-02-17 DIAGNOSIS — J029 Acute pharyngitis, unspecified: Secondary | ICD-10-CM | POA: Diagnosis not present

## 2020-02-17 DIAGNOSIS — Z20822 Contact with and (suspected) exposure to covid-19: Secondary | ICD-10-CM | POA: Insufficient documentation

## 2020-02-17 DIAGNOSIS — E119 Type 2 diabetes mellitus without complications: Secondary | ICD-10-CM | POA: Diagnosis not present

## 2020-02-17 MED ORDER — PREDNISONE 50 MG PO TABS
50.0000 mg | ORAL_TABLET | Freq: Every day | ORAL | 0 refills | Status: AC
Start: 2020-02-17 — End: 2020-02-22

## 2020-02-17 MED ORDER — AMOXICILLIN-POT CLAVULANATE 875-125 MG PO TABS: 1.0000 | ORAL_TABLET | Freq: Two times a day (BID) | ORAL | 0 refills | Status: AC

## 2020-02-17 MED ORDER — HYDROCODONE-HOMATROPINE 5-1.5 MG/5ML PO SYRP: 5.0000 mL | ORAL_SOLUTION | Freq: Every evening | ORAL | 0 refills | Status: DC | PRN

## 2020-02-17 MED ORDER — BENZONATATE 200 MG PO CAPS
200.0000 mg | ORAL_CAPSULE | Freq: Three times a day (TID) | ORAL | 0 refills | Status: AC | PRN
Start: 2020-02-17 — End: 2020-02-24

## 2020-02-17 NOTE — ED Triage Notes (Signed)
Pt c/o productive cough w/white mucous and sinus pressurex10 days. Pt denies any other sx.

## 2020-02-17 NOTE — Discharge Instructions (Signed)
COVID test pending Augmentin twice daily for 1 week Prednisone daily with food Continue mucinex Tessalon for daytime cough Hycodan for nightitme cough Rest and fluids Follow up if not improving or worsening

## 2020-02-18 LAB — SARS CORONAVIRUS 2 (TAT 6-24 HRS): SARS Coronavirus 2: NEGATIVE

## 2020-02-18 NOTE — ED Provider Notes (Signed)
[place]    CSN: [id number] Arrival date & time: 02/17/20  1914      History   Chief Complaint Chief Complaint  Patient presents with   Cough    HPI Tyler Carroll is a 44 y.o. male history of hypertension, prediabetes, presenting today for evaluation of URI symptoms and cough.  Patient reports over the past 10 days he has had a lot of mucus production from his sinuses, sinus pressure as well as a cough.  Cough has been productive.  Feels symptoms moving more towards chest.  Denies any fevers.  Denies any close sick contacts or Covid exposure.  Using Mucinex and other over-the-counter medicine without relief.  Denies chest pain.  Does report some wheezing at nighttime.   HPI  Past Medical History:  Diagnosis Date   Diabetes mellitus without complication ([place])    "pre-diabetic" 01/01/20   Hypertension     Patient Active Problem List   Diagnosis Date Noted   Peripheral edema 05/02/2019   Hypertension 05/02/2019    History reviewed. No pertinent surgical history.     Home Medications    Prior to Admission medications   Medication Sig Start Date End Date Taking? Authorizing Provider  amoxicillin-clavulanate (AUGMENTIN) 875-125 MG tablet Take 1 tablet by mouth every 12 (twelve) hours for 7 days. 02/17/20 02/24/20  Nikos Anglemyer C, PA-C  benzonatate (TESSALON) 200 MG capsule Take 1 capsule (200 mg total) by mouth 3 (three) times daily as needed for up to 7 days for cough (daytime). 02/17/20 02/24/20  Jalisia Puchalski C, PA-C  Blood Pressure Monitoring (BLOOD PRESSURE KIT) KIT Check BP as needed. 09/18/18   [provider]  furosemide (LASIX) 40 MG tablet Take 1 tablet (40 mg total) by mouth daily. 01/01/20 01/31/20  Jaynee Eagles, PA-C  HYDROcodone-homatropine [facility]) 5-1.5 MG/5ML syrup Take 5 mLs by mouth at bedtime as needed for cough. 02/17/20   Diesel Lina C, PA-C  ibuprofen (ADVIL) 800 MG tablet Take 1 tablet (800 mg total) by mouth every 8  (eight) hours as needed. 07/10/19   Zigmund Gottron, NP  losartan (COZAAR) 100 MG tablet Take 1 tablet (100 mg total) by mouth daily. 01/01/20   Jaynee Eagles, PA-C  metFORMIN (GLUCOPHAGE) 500 MG tablet Take 1 tablet (500 mg total) by mouth 2 (two) times daily with a meal. 10/24/19   Raylene Everts, MD  naproxen (NAPROSYN) 500 MG tablet Take 1 tablet (500 mg total) by mouth 2 (two) times daily with a meal. 01/01/20   Jaynee Eagles, PA-C  potassium chloride (KLOR-CON) 10 MEQ tablet Take 1 tablet (10 mEq total) by mouth 2 (two) times daily. 10/24/19   Raylene Everts, MD  predniSONE (DELTASONE) 50 MG tablet Take 1 tablet (50 mg total) by mouth daily with breakfast for 5 days. 02/17/20 02/22/20  Ladonya Jerkins C, PA-C  losartan-hydrochlorothiazide (HYZAAR) 100-25 MG tablet Take 1 tablet by mouth daily. 10/24/19 01/01/20  Raylene Everts, MD  potassium chloride (KLOR-CON) 10 MEQ tablet Take by mouth. 12/27/18 09/03/19  [provider]  rosuvastatin (CRESTOR) 10 MG tablet Take 1 tablet (10 mg total) by mouth daily. 10/24/19 02/17/20  Raylene Everts, MD    Family History Family History  Problem Relation Age of Onset   Hypertension Mother    Heart failure Father     Social History Social History   Tobacco Use   Smoking status: Never Smoker   Smokeless tobacco: Never Used  Substance Use Topics   Alcohol  use: Not Currently   Drug use: Never     Allergies   Patient has no known allergies.   Review of Systems Review of Systems  Constitutional: Negative for activity change, appetite change, chills, fatigue and fever.  HENT: Positive for congestion, rhinorrhea, sinus pressure and sore throat. Negative for ear pain and trouble swallowing.   Eyes: Negative for discharge and redness.  Respiratory: Positive for cough. Negative for chest tightness and shortness of breath.   Cardiovascular: Negative for chest pain.  Gastrointestinal: Negative for abdominal pain, diarrhea, nausea and  vomiting.  Musculoskeletal: Negative for myalgias.  Skin: Negative for rash.  Neurological: Negative for dizziness, light-headedness and headaches.     Physical Exam Triage Vital Signs ED Triage Vitals  Enc Vitals Group     BP 02/17/20 2052 (!) 146/91     Pulse Rate 02/17/20 2052 (!) 101     Resp 02/17/20 2052 20     Temp 02/17/20 2052 98.3 F (36.8 C)     Temp Source 02/17/20 2052 Oral     SpO2 02/17/20 2052 100 %     Weight 02/17/20 2049 (!) 452 lb (205 kg)     Height 02/17/20 2049 6' (1.829 m)     Head Circumference --      Peak Flow --      Pain Score 02/17/20 2049 5     Pain Loc --      Pain Edu? --      Excl. in [place]? --    No data found.  Updated Vital Signs BP (!) 146/91    Pulse (!) 101    Temp 98.3 F (36.8 C) (Oral)    Resp 20    Ht 6' (1.829 m)    Wt (!) 452 lb (205 kg)    SpO2 100%    BMI 61.30 kg/m   Visual Acuity Right Eye Distance:   Left Eye Distance:   Bilateral Distance:    Right Eye Near:   Left Eye Near:    Bilateral Near:     Physical Exam Vitals and nursing note reviewed.  Constitutional:      Appearance: He is well-developed.     Comments: No acute distress  HENT:     Head: Normocephalic and atraumatic.     Ears:     Comments: Bilateral ears without tenderness to palpation of external auricle, tragus and mastoid, EAC's without erythema or swelling, TM's with good bony landmarks and cone of light. Non erythematous.     Nose: Nose normal.     Mouth/Throat:     Comments: Oral mucosa pink and moist, no tonsillar enlargement or exudate. Posterior pharynx patent and nonerythematous, no uvula deviation or swelling. Normal phonation. Eyes:     Conjunctiva/sclera: Conjunctivae normal.  Cardiovascular:     Rate and Rhythm: Normal rate.  Pulmonary:     Effort: Pulmonary effort is normal. No respiratory distress.     Comments: Breathing comfortably at rest, CTABL, no wheezing, rales or other adventitious sounds auscultated Abdominal:      General: There is no distension.  Musculoskeletal:        General: Normal range of motion.     Cervical back: Neck supple.  Skin:    General: Skin is warm and dry.  Neurological:     Mental Status: He is alert and oriented to person, place, and time.      UC Treatments / Results  Labs (all labs ordered are listed, but only abnormal results  are displayed) Labs Reviewed  SARS CORONAVIRUS 2 (TAT 6-24 HRS)    EKG   Radiology No results found.  Procedures Procedures (including critical care time)  Medications Ordered in UC Medications - No data to display  Initial Impression / Assessment and Plan / UC Course  I have reviewed the triage vital signs and the nursing notes.  Pertinent labs & imaging results that were available during my care of the patient were reviewed by me and considered in my medical decision making (see chart for details).     Treating for sinusitis with Augmentin x1 week, prednisone course to help with inflammation sinuses/lungs, Tessalon for daytime cough, Hycodan for nighttime cough, continue Mucinex.  Discussed strict return precautions. Patient verbalized understanding and is agreeable with plan.  Final Clinical Impressions(s) / UC Diagnoses   Final diagnoses:  Acute sinusitis with symptoms > 10 days     Discharge Instructions     COVID test pending Augmentin twice daily for 1 week Prednisone daily with food Continue mucinex Tessalon for daytime cough Hycodan for nightitme cough Rest and fluids Follow up if not improving or worsening   ED Prescriptions    Medication Sig Dispense Auth. Provider   amoxicillin-clavulanate (AUGMENTIN) 875-125 MG tablet Take 1 tablet by mouth every 12 (twelve) hours for 7 days. 14 tablet Coltrane Tugwell C, PA-C   predniSONE (DELTASONE) 50 MG tablet Take 1 tablet (50 mg total) by mouth daily with breakfast for 5 days. 5 tablet Mignon Bechler C, PA-C   benzonatate (TESSALON) 200 MG capsule Take 1 capsule  (200 mg total) by mouth 3 (three) times daily as needed for up to 7 days for cough (daytime). 28 capsule Neftaly Inzunza C, PA-C   HYDROcodone-homatropine (HYCODAN) 5-1.5 MG/5ML syrup Take 5 mLs by mouth at bedtime as needed for cough. 75 mL Mozes Sagar, [place] C, PA-C     I have reviewed the PDMP during this encounter.   Janith Lima, [place] 02/18/20 [phone]

## 2020-10-19 LAB — BASIC METABOLIC PANEL
BUN: 14 (ref 4–21)
CO2: 27 — AB (ref 13–22)
Chloride: 102 (ref 99–108)
Creatinine: 0.8 (ref 0.6–1.3)
Glucose: 104
Potassium: 4.3 mEq/L (ref 3.5–5.1)
Sodium: 137 (ref 137–147)

## 2020-10-19 LAB — CBC AND DIFFERENTIAL
HCT: 40 — AB (ref 41–53)
Hemoglobin: 12.3 — AB (ref 13.5–17.5)
Platelets: 348 10*3/uL (ref 150–400)
WBC: 8.3

## 2020-10-19 LAB — COMPREHENSIVE METABOLIC PANEL
Albumin: 4.1 (ref 3.5–5.0)
Globulin: 3
eGFR: 129

## 2020-10-19 LAB — HEMOGLOBIN A1C: Hemoglobin A1C: 6.1

## 2020-10-19 LAB — HEPATIC FUNCTION PANEL
ALT: 16 U/L (ref 10–40)
AST: 14 (ref 14–40)
Bilirubin, Total: 0.4

## 2020-10-19 LAB — VITAMIN D 25 HYDROXY (VIT D DEFICIENCY, FRACTURES): Vit D, 25-Hydroxy: 23.88

## 2020-10-19 LAB — HM HEPATITIS C SCREENING LAB: HM Hepatitis Screen: NEGATIVE

## 2020-10-19 LAB — CBC: RBC: 4.74 (ref 3.87–5.11)

## 2021-05-21 ENCOUNTER — Ambulatory Visit (HOSPITAL_COMMUNITY)
Admission: EM | Admit: 2021-05-21 | Discharge: 2021-05-21 | Disposition: A | Discharge status: Home / Self Care | Payer: BC Managed Care – PPO | Attending: Urgent Care | Admitting: Urgent Care

## 2021-05-21 ENCOUNTER — Encounter (HOSPITAL_COMMUNITY): Payer: Self-pay | Admitting: Emergency Medicine

## 2021-05-21 ENCOUNTER — Ambulatory Visit (INDEPENDENT_AMBULATORY_CARE_PROVIDER_SITE_OTHER): Payer: BC Managed Care – PPO

## 2021-05-21 ENCOUNTER — Other Ambulatory Visit: Payer: Self-pay

## 2021-05-21 DIAGNOSIS — R062 Wheezing: Secondary | ICD-10-CM | POA: Diagnosis not present

## 2021-05-21 DIAGNOSIS — I10 Essential (primary) hypertension: Secondary | ICD-10-CM | POA: Insufficient documentation

## 2021-05-21 DIAGNOSIS — R0981 Nasal congestion: Secondary | ICD-10-CM

## 2021-05-21 DIAGNOSIS — R059 Cough, unspecified: Secondary | ICD-10-CM | POA: Diagnosis not present

## 2021-05-21 DIAGNOSIS — J3489 Other specified disorders of nose and nasal sinuses: Secondary | ICD-10-CM | POA: Insufficient documentation

## 2021-05-21 DIAGNOSIS — U071 COVID-19: Secondary | ICD-10-CM | POA: Diagnosis not present

## 2021-05-21 DIAGNOSIS — J069 Acute upper respiratory infection, unspecified: Secondary | ICD-10-CM

## 2021-05-21 DIAGNOSIS — Z79899 Other long term (current) drug therapy: Secondary | ICD-10-CM | POA: Diagnosis not present

## 2021-05-21 DIAGNOSIS — Z7984 Long term (current) use of oral hypoglycemic drugs: Secondary | ICD-10-CM | POA: Diagnosis not present

## 2021-05-21 LAB — POC INFLUENZA A AND B ANTIGEN (URGENT CARE ONLY)
INFLUENZA A ANTIGEN, POC: NEGATIVE
INFLUENZA B ANTIGEN, POC: NEGATIVE

## 2021-05-21 MED ORDER — BENZONATATE 100 MG PO CAPS: 100.0000 mg | ORAL_CAPSULE | Freq: Three times a day (TID) | ORAL | 0 refills | Status: DC | PRN

## 2021-05-21 MED ORDER — CETIRIZINE HCL 10 MG PO TABS
10.0000 mg | ORAL_TABLET | Freq: Every day | ORAL | 0 refills | Status: DC
Start: 2021-05-21 — End: 2022-11-03

## 2021-05-21 MED ORDER — PROMETHAZINE-DM 6.25-15 MG/5ML PO SYRP
5.0000 mL | ORAL_SOLUTION | Freq: Every evening | ORAL | 0 refills | Status: DC | PRN
Start: 2021-05-21 — End: 2021-06-01

## 2021-05-21 NOTE — ED Triage Notes (Signed)
Patient c/o sinus pressure and nasal congestion x 3 days.   Patient denies fever at home.   Patient has taken over-the-counter decongestants with no relief of symptoms.

## 2021-05-21 NOTE — Discharge Instructions (Signed)
We will notify you of your test results as they arrive and may take between 48-72 hours.  I encourage you to sign up for MyChart if you have not already done so as this can be the easiest way for us to communicate results to you online or through a phone app.  Generally, we only contact you if it is a positive test result.  In the meantime, if you develop worsening symptoms including fever, chest pain, shortness of breath despite our current treatment plan then please report to the emergency room as this may be a sign of worsening status from possible viral infection. ° °Otherwise, we will manage this as a viral syndrome. For sore throat or cough try using a honey-based tea. Use 3 teaspoons of honey with juice squeezed from half lemon. Place shaved pieces of ginger into 1/2-1 cup of water and warm over stove top. Then mix the ingredients and repeat every 4 hours as needed. Please take Tylenol 500mg-650mg every 6 hours for aches and pains, fevers. Hydrate very well with at least 2 liters of water. Eat light meals such as soups to replenish electrolytes and soft fruits, veggies. Start an antihistamine like Zyrtec for postnasal drainage, sinus congestion.  You can take this together with pseudoephedrine (Sudafed) at a dose of 60 mg 2-3 times a day as needed for the same kind of congestion.  Use the cough medications as needed.  °

## 2021-05-21 NOTE — ED Provider Notes (Signed)
[place]   MRN: [id number] DOB: 22-May-1976  Subjective:   Tyler Carroll is a 45 y.o. male presenting for 3 day history of sinus pressure, sinus congestion. Started coughing today.  In general, due to his body habitus, morbid obesity reports that he does get easily winded with strenuous activity but has been slightly worse in the past 3 days.  Has a history of HTN, pre-diabetic. Takes his medications for this but did not take them today. No history of chf. Has been using Sudafed and ibuprofen for his symptoms.  No active chest pain, fevers, body aches, hemoptysis.  No history of PE.  No current facility-administered medications for this encounter.  Current Outpatient Medications:    amLODipine (NORVASC) 10 MG tablet, Take 10 mg by mouth daily., Disp: , Rfl:    furosemide (LASIX) 40 MG tablet, Take 1 tablet (40 mg total) by mouth daily., Disp: 30 tablet, Rfl: 0   losartan (COZAAR) 100 MG tablet, Take 1 tablet (100 mg total) by mouth daily., Disp: 30 tablet, Rfl: 0   metFORMIN (GLUCOPHAGE) 500 MG tablet, Take 1 tablet (500 mg total) by mouth 2 (two) times daily with a meal., Disp: 60 tablet, Rfl: 1   potassium chloride (KLOR-CON) 10 MEQ tablet, Take 1 tablet (10 mEq total) by mouth 2 (two) times daily., Disp: 60 tablet, Rfl: 1   Vitamin D, Ergocalciferol, (DRISDOL) 1.25 MG (50000 UNIT) CAPS capsule, Take 50,000 Units by mouth once a week., Disp: , Rfl:    Blood Pressure Monitoring (BLOOD PRESSURE KIT) KIT, Check BP as needed., Disp: , Rfl:    HYDROcodone-homatropine (HYCODAN) 5-1.5 MG/5ML syrup, Take 5 mLs by mouth at bedtime as needed for cough., Disp: 75 mL, Rfl: 0   ibuprofen (ADVIL) 800 MG tablet, Take 1 tablet (800 mg total) by mouth every 8 (eight) hours as needed., Disp: 21 tablet, Rfl: 0   naproxen (NAPROSYN) 500 MG tablet, Take 1 tablet (500 mg total) by mouth 2 (two) times daily with a meal., Disp: 30 tablet, Rfl: 0   No Known Allergies  Past Medical History:   Diagnosis Date   Diabetes mellitus without complication ([place])    "pre-diabetic" 01/01/20   Hypertension      History reviewed. No pertinent surgical history.  Family History  Problem Relation Age of Onset   Hypertension Mother    Heart failure Father     Social History   Tobacco Use   Smoking status: Never   Smokeless tobacco: Never  Substance Use Topics   Alcohol use: Not Currently   Drug use: Never    ROS   Objective:   Vitals: BP (!) 183/81 (BP Location: Right Arm)    Pulse 94    Temp 98.2 F (36.8 C) (Oral)    Resp 18    SpO2 95%   Physical Exam Constitutional:      General: He is not in acute distress.    Appearance: Normal appearance. He is well-developed. He is obese. He is ill-appearing. He is not toxic-appearing or diaphoretic.  HENT:     Head: Normocephalic and atraumatic.     Right Ear: Tympanic membrane, ear canal and external ear normal. There is no impacted cerumen.     Left Ear: Tympanic membrane, ear canal and external ear normal. There is no impacted cerumen.     Nose: Nose normal. No congestion or rhinorrhea.     Mouth/Throat:     Mouth: Mucous membranes are moist.  Pharynx: No oropharyngeal exudate or posterior oropharyngeal erythema.  Eyes:     General: No scleral icterus.       Right eye: No discharge.        Left eye: No discharge.     Extraocular Movements: Extraocular movements intact.     Conjunctiva/sclera: Conjunctivae normal.     Pupils: Pupils are equal, round, and reactive to light.  Cardiovascular:     Rate and Rhythm: Normal rate and regular rhythm.     Heart sounds: Normal heart sounds. No murmur heard.   No friction rub. No gallop.  Pulmonary:     Effort: Pulmonary effort is normal. No respiratory distress.     Breath sounds: No stridor. Rhonchi (right sided) present. No wheezing or rales.  Musculoskeletal:     Cervical back: Normal range of motion and neck supple. No rigidity. No muscular tenderness.  Neurological:      General: No focal deficit present.     Mental Status: He is alert and oriented to person, place, and time.  Psychiatric:        Mood and Affect: Mood normal.        Behavior: Behavior normal.        Thought Content: Thought content normal.        Judgment: Judgment normal.   DG Chest 2 View  Result Date: 05/21/2021 CLINICAL DATA:  cough, wheezing EXAM: CHEST - 2 VIEW COMPARISON:  05/17/2006. FINDINGS: No consolidation. No visible pleural effusions or pneumothorax. Cardiomediastinal silhouette is within normal limits and similar to prior. Degenerative change of the thoracic spine. IMPRESSION: No evidence of acute cardiopulmonary disease. Electronically Signed   By: Margaretha Sheffield M.D.   On: 05/21/2021 15:40    Results for orders placed or performed during the hospital encounter of 05/21/21 (from the past 24 hour(s))  POC Influenza A & B Ag (Urgent Care)     Status: None   Collection Time: 05/21/21  3:45 PM  Result Value Ref Range   INFLUENZA A ANTIGEN, POC NEGATIVE NEGATIVE   INFLUENZA B ANTIGEN, POC NEGATIVE NEGATIVE    Assessment and Plan :   PDMP not reviewed this encounter.  1. Viral URI with cough   2. Nasal congestion   3. Sinus pressure   4. Morbid obesity ([name])   5. Essential hypertension    Discussed antibiotic stewardship.  For now will manage for viral respiratory infection, COVID-19 testing pending.  Recommended supportive care.  Low suspicion for pulmonary embolism but discussed with patient that if his shortness of breath worsens he needs to present to the emergency room.  Counseled patient on potential for adverse effects with medications prescribed/recommended today, ER and return-to-clinic precautions discussed, patient verbalized understanding.    Jaynee Eagles, PA-C 05/21/21 1601

## 2021-05-22 LAB — SARS CORONAVIRUS 2 (TAT 6-24 HRS): SARS Coronavirus 2: POSITIVE — AB

## 2021-06-01 ENCOUNTER — Encounter (HOSPITAL_COMMUNITY): Payer: Self-pay | Admitting: Emergency Medicine

## 2021-06-01 ENCOUNTER — Other Ambulatory Visit: Payer: Self-pay

## 2021-06-01 ENCOUNTER — Ambulatory Visit (HOSPITAL_COMMUNITY)
Admission: EM | Admit: 2021-06-01 | Discharge: 2021-06-01 | Disposition: A | Discharge status: Home / Self Care | Payer: BC Managed Care – PPO | Attending: Physician Assistant | Admitting: Physician Assistant

## 2021-06-01 DIAGNOSIS — J40 Bronchitis, not specified as acute or chronic: Secondary | ICD-10-CM

## 2021-06-01 DIAGNOSIS — J329 Chronic sinusitis, unspecified: Secondary | ICD-10-CM | POA: Diagnosis not present

## 2021-06-01 DIAGNOSIS — H66002 Acute suppurative otitis media without spontaneous rupture of ear drum, left ear: Secondary | ICD-10-CM

## 2021-06-01 MED ORDER — PROMETHAZINE-DM 6.25-15 MG/5ML PO SYRP
5.0000 mL | ORAL_SOLUTION | Freq: Every evening | ORAL | 0 refills | Status: DC | PRN
Start: 2021-06-01 — End: 2022-11-03

## 2021-06-01 MED ORDER — AMOXICILLIN-POT CLAVULANATE 875-125 MG PO TABS: 1.0000 | ORAL_TABLET | Freq: Two times a day (BID) | ORAL | 0 refills | Status: DC

## 2021-06-01 NOTE — ED Provider Notes (Signed)
[place]    CSN: [id number] Arrival date & time: 06/01/21  1435      History   Chief Complaint Chief Complaint  Patient presents with   URI    HPI Tyler Carroll is a 45 y.o. male.   Patient presents today with a 10-day history of URI symptoms.  He was seen by our clinic on 05/21/2021 at which point he was treated for viral upper respiratory infection and ultimately tested positive for COVID-19.  Patient was not aware of this result since this was sent through [facility] and he does not monitor his MyChart regularly.  He reports that many of his symptoms have improved and he is no longer experiencing severe congestion or cough but continues to have mild symptoms that tend to linger.  He also reports ongoing left ear pain that is not present since symptom onset.  He denies any recent swimming, airplane travel, recent diving.  Pain is rated 9 on a 0-10 pain scale, localized to left ear, described as aching, no aggravating or alleviating factors benefit.  He has been using over-the-counter medication including Sudafed and allergy medicine without improvement of symptoms.  Denies any recent antibiotic use.   Past Medical History:  Diagnosis Date   Diabetes mellitus without complication ([place])    "pre-diabetic" 01/01/20   Hypertension     Patient Active Problem List   Diagnosis Date Noted   Peripheral edema 05/02/2019   Hypertension 05/02/2019    History reviewed. No pertinent surgical history.     Home Medications    Prior to Admission medications   Medication Sig Start Date End Date Taking? Authorizing Provider  amoxicillin-clavulanate (AUGMENTIN) 875-125 MG tablet Take 1 tablet by mouth every 12 (twelve) hours. 06/01/21  Yes Ambree Frances K, PA-C  amLODipine (NORVASC) 10 MG tablet Take 10 mg by mouth daily. 05/21/21   [provider]  benzonatate (TESSALON) 100 MG capsule Take 1-2 capsules (100-200 mg total) by mouth 3 (three) times daily as needed for  cough. Patient not taking: Reported on 06/01/2021 05/21/21   Jaynee Eagles, PA-C  Blood Pressure Monitoring (BLOOD PRESSURE KIT) KIT Check BP as needed. 09/18/18   [provider]  cetirizine (ZYRTEC ALLERGY) 10 MG tablet Take 1 tablet (10 mg total) by mouth daily. 05/21/21   Jaynee Eagles, PA-C  furosemide (LASIX) 40 MG tablet Take 1 tablet (40 mg total) by mouth daily. 01/01/20 05/21/21  Jaynee Eagles, PA-C  ibuprofen (ADVIL) 800 MG tablet Take 1 tablet (800 mg total) by mouth every 8 (eight) hours as needed. 07/10/19   Zigmund Gottron, NP  losartan (COZAAR) 100 MG tablet Take 1 tablet (100 mg total) by mouth daily. 01/01/20   Jaynee Eagles, PA-C  metFORMIN (GLUCOPHAGE) 500 MG tablet Take 1 tablet (500 mg total) by mouth 2 (two) times daily with a meal. 10/24/19   Raylene Everts, MD  naproxen (NAPROSYN) 500 MG tablet Take 1 tablet (500 mg total) by mouth 2 (two) times daily with a meal. 01/01/20   Jaynee Eagles, PA-C  potassium chloride (KLOR-CON) 10 MEQ tablet Take 1 tablet (10 mEq total) by mouth 2 (two) times daily. 10/24/19   Raylene Everts, MD  promethazine-dextromethorphan (PROMETHAZINE-DM) 6.25-15 MG/5ML syrup Take 5 mLs by mouth at bedtime as needed for cough. 06/01/21   Aisia Correira, Derry Skill, PA-C  Vitamin D, Ergocalciferol, (DRISDOL) 1.25 MG (50000 UNIT) CAPS capsule Take 50,000 Units by mouth once a week. 02/08/21   [provider]  losartan-hydrochlorothiazide Konrad Penta)  100-25 MG tablet Take 1 tablet by mouth daily. 10/24/19 01/01/20  Raylene Everts, MD  potassium chloride (KLOR-CON) 10 MEQ tablet Take by mouth. 12/27/18 09/03/19  [provider]  rosuvastatin (CRESTOR) 10 MG tablet Take 1 tablet (10 mg total) by mouth daily. 10/24/19 02/17/20  Raylene Everts, MD    Family History Family History  Problem Relation Age of Onset   Hypertension Mother    Heart failure Father     Social History Social History   Tobacco Use   Smoking status: Never   Smokeless tobacco: Never   Vaping Use   Vaping Use: Never used  Substance Use Topics   Alcohol use: Not Currently   Drug use: Never     Allergies   Patient has no known allergies.   Review of Systems Review of Systems  Constitutional:  Positive for activity change. Negative for appetite change, fatigue and fever.  HENT:  Positive for congestion, ear pain and sinus pressure. Negative for sneezing and sore throat.   Respiratory:  Positive for cough. Negative for shortness of breath.   Cardiovascular:  Negative for chest pain.  Gastrointestinal:  Negative for abdominal pain, diarrhea, nausea and vomiting.  Neurological:  Negative for dizziness, light-headedness and headaches.    Physical Exam Triage Vital Signs ED Triage Vitals  Enc Vitals Group     BP 06/01/21 1606 (!) 160/87     Pulse Rate 06/01/21 1606 90     Resp 06/01/21 1606 (!) 22     Temp 06/01/21 1606 98.7 F (37.1 C)     Temp Source 06/01/21 1606 Oral     SpO2 06/01/21 1606 96 %     Weight --      Height --      Head Circumference --      Peak Flow --      Pain Score 06/01/21 1602 9     Pain Loc --      Pain Edu? --      Excl. in [place]? --    No data found.  Updated Vital Signs BP (!) 160/87 (BP Location: Left Arm) Comment (BP Location): large cuff   Pulse 90    Temp 98.7 F (37.1 C) (Oral)    Resp (!) 22    SpO2 96%   Visual Acuity Right Eye Distance:   Left Eye Distance:   Bilateral Distance:    Right Eye Near:   Left Eye Near:    Bilateral Near:     Physical Exam Vitals reviewed.  Constitutional:      General: He is awake.     Appearance: Normal appearance. He is well-developed. He is not ill-appearing.     Comments: Very pleasant male appears stated age in no acute distress sitting comfortably in exam room  HENT:     Head: Normocephalic and atraumatic.     Right Ear: Tympanic membrane, ear canal and external ear normal. Tympanic membrane is not erythematous or bulging.     Left Ear: External ear normal. Tympanic  membrane is erythematous and bulging.     Nose:     Right Sinus: Maxillary sinus tenderness present. No frontal sinus tenderness.     Left Sinus: Maxillary sinus tenderness present. No frontal sinus tenderness.     Mouth/Throat:     Pharynx: Uvula midline. Posterior oropharyngeal erythema present. No oropharyngeal exudate or uvula swelling.     Comments: Erythema and drainage in posterior pharynx Cardiovascular:     Rate and  Rhythm: Normal rate and regular rhythm.     Heart sounds: Normal heart sounds, S1 normal and S2 normal. No murmur heard. Pulmonary:     Effort: Pulmonary effort is normal. No accessory muscle usage or respiratory distress.     Breath sounds: Normal breath sounds. No stridor. No wheezing, rhonchi or rales.     Comments: Clear to auscultation bilaterally Abdominal:     General: Bowel sounds are normal.     Palpations: Abdomen is soft.     Tenderness: There is no abdominal tenderness.  Neurological:     Mental Status: He is alert.  Psychiatric:        Behavior: Behavior is cooperative.     UC Treatments / Results  Labs (all labs ordered are listed, but only abnormal results are displayed) Labs Reviewed - No data to display  EKG   Radiology No results found.  Procedures Procedures (including critical care time)  Medications Ordered in UC Medications - No data to display  Initial Impression / Assessment and Plan / UC Course  I have reviewed the triage vital signs and the nursing notes.  Pertinent labs & imaging results that were available during my care of the patient were reviewed by me and considered in my medical decision making (see chart for details).     Discussed with patient recent diagnosis of COVID-19 but has had improvement of symptoms and is outside of the quarantine window.  Discussed concern for secondary infection given physical exam findings and prolonged and worsening symptoms.  We will start Augmentin twice daily for 7 days.  He was  encouraged to use over-the-counter medication as needed for additional symptom relief.  He did request a refill of Promethazine DM previously prescribed as he is without this medication and cough tends to worsen at night; refill was provided.  Recommended he use Tylenol, Mucinex, Flonase for additional symptom relief.  Discussed that if he has persistent or worsening symptoms he needs to be reevaluated.  Strict return precautions given to which she expressed understanding.  Final Clinical Impressions(s) / UC Diagnoses   Final diagnoses:  Sinobronchitis  Non-recurrent acute suppurative otitis media of left ear without spontaneous rupture of tympanic membrane     Discharge Instructions      Patient was started on Augmentin twice daily for 7 days.  Use over-the-counter medications for additional symptom relief.  I have provided a refill of Promethazine DM.  This can make you sleepy so do not drive or drink alcohol with taking it.  If your symptoms or not improving within a few days please return for reevaluation.  If anything worsens you need to be seen immediately.     ED Prescriptions     Medication Sig Dispense Auth. Provider   amoxicillin-clavulanate (AUGMENTIN) 875-125 MG tablet Take 1 tablet by mouth every 12 (twelve) hours. 14 tablet Harrel Ferrone K, PA-C   promethazine-dextromethorphan (PROMETHAZINE-DM) 6.25-15 MG/5ML syrup Take 5 mLs by mouth at bedtime as needed for cough. 100 mL Meliana Canner K, PA-C      I have reviewed the PDMP during this encounter.   Terrilee Croak, PA-C 06/01/21 1626

## 2021-06-01 NOTE — ED Triage Notes (Signed)
05/18/2021 symptoms started .  Patient was seen 05/21/2021.  Left ear : is hurting, hearing muffled.  This is the only complaint remaining.  Also reports headache, fever 100.2.

## 2021-06-01 NOTE — Discharge Instructions (Signed)
Patient was started on Augmentin twice daily for 7 days.  Use over-the-counter medications for additional symptom relief.  I have provided a refill of Promethazine DM.  This can make you sleepy so do not drive or drink alcohol with taking it.  If your symptoms or not improving within a few days please return for reevaluation.  If anything worsens you need to be seen immediately.

## 2021-10-23 ENCOUNTER — Encounter (HOSPITAL_COMMUNITY): Payer: Self-pay

## 2021-10-23 ENCOUNTER — Ambulatory Visit (HOSPITAL_COMMUNITY)
Admission: EM | Admit: 2021-10-23 | Discharge: 2021-10-23 | Disposition: A | Discharge status: Home / Self Care | Payer: BC Managed Care – PPO | Attending: Family Medicine | Admitting: Family Medicine

## 2021-10-23 DIAGNOSIS — Z760 Encounter for issue of repeat prescription: Secondary | ICD-10-CM

## 2021-10-23 DIAGNOSIS — Z7984 Long term (current) use of oral hypoglycemic drugs: Secondary | ICD-10-CM

## 2021-10-23 DIAGNOSIS — I10 Essential (primary) hypertension: Secondary | ICD-10-CM

## 2021-10-23 DIAGNOSIS — E11638 Type 2 diabetes mellitus with other oral complications: Secondary | ICD-10-CM

## 2021-10-23 DIAGNOSIS — K047 Periapical abscess without sinus: Secondary | ICD-10-CM | POA: Diagnosis not present

## 2021-10-23 DIAGNOSIS — E1159 Type 2 diabetes mellitus with other circulatory complications: Secondary | ICD-10-CM

## 2021-10-23 DIAGNOSIS — E119 Type 2 diabetes mellitus without complications: Secondary | ICD-10-CM

## 2021-10-23 MED ORDER — METFORMIN HCL 500 MG PO TABS: 500.0000 mg | ORAL_TABLET | Freq: Two times a day (BID) | ORAL | 0 refills | Status: DC

## 2021-10-23 MED ORDER — FUROSEMIDE 40 MG PO TABS: 40.0000 mg | ORAL_TABLET | Freq: Every day | ORAL | 0 refills | Status: DC

## 2021-10-23 MED ORDER — POTASSIUM CHLORIDE CRYS ER 10 MEQ PO TBCR: 20.0000 meq | EXTENDED_RELEASE_TABLET | Freq: Every day | ORAL | 0 refills | Status: DC

## 2021-10-23 MED ORDER — LOSARTAN POTASSIUM 100 MG PO TABS: 100.0000 mg | ORAL_TABLET | Freq: Every day | ORAL | 0 refills | Status: DC

## 2021-10-23 MED ORDER — AMLODIPINE BESYLATE 10 MG PO TABS: 10.0000 mg | ORAL_TABLET | Freq: Every day | ORAL | 0 refills | Status: DC

## 2021-10-23 MED ORDER — NAPROXEN 500 MG PO TABS: 500.0000 mg | ORAL_TABLET | Freq: Two times a day (BID) | ORAL | 0 refills | Status: DC

## 2021-10-23 MED ORDER — AMOXICILLIN-POT CLAVULANATE 875-125 MG PO TABS: 1.0000 | ORAL_TABLET | Freq: Two times a day (BID) | ORAL | 0 refills | Status: DC

## 2021-10-23 NOTE — ED Provider Notes (Signed)
[place]    CSN: [id number] Arrival date & time: 10/23/21  1205      History   Chief Complaint Chief Complaint  Patient presents with   Dental Pain   Medication Refill    HPI Tyler Carroll is a 46 y.o. male.   Left upper tooth pain Reports that his tooth broke off in the last few weeks It for about 8 days has been having worsening pain in the tooth Has difficulty chewing No fevers, but has not been feeling well recently No difficulty swallowing Has dental insurance, but has not been able to find a dentist yet  Reports that he has had difficulty getting back to his primary care provider due to cost issues He requests refills on amlodipine 10 mg, Lasix 40 mg, losartan 100 mg, metformin 500 mg twice daily, potassium 20 mEq daily He denies any chest pain, difficulty breathing, changes in urination, focal weakness, focal paresthesias He has not yet taken his blood pressure medicine this morning, but has not been without it   Past Medical History:  Diagnosis Date   Diabetes mellitus without complication ([place])    "pre-diabetic" 01/01/20   Hypertension     Patient Active Problem List   Diagnosis Date Noted   Peripheral edema 05/02/2019   Hypertension 05/02/2019    History reviewed. No pertinent surgical history.     Home Medications    Prior to Admission medications   Medication Sig Start Date End Date Taking? Authorizing Provider  amoxicillin-clavulanate (AUGMENTIN) 875-125 MG tablet Take 1 tablet by mouth every 12 (twelve) hours. 10/23/21  Yes Demarqus Jocson, Bernita Raisin, DO  amLODipine (NORVASC) 10 MG tablet Take 1 tablet (10 mg total) by mouth daily. 10/23/21   Jaiquan Temme, Bernita Raisin, DO  benzonatate (TESSALON) 100 MG capsule Take 1-2 capsules (100-200 mg total) by mouth 3 (three) times daily as needed for cough. Patient not taking: Reported on 06/01/2021 05/21/21   Jaynee Eagles, PA-C  Blood Pressure Monitoring (BLOOD PRESSURE KIT) KIT Check BP as needed.  09/18/18   [provider]  cetirizine (ZYRTEC ALLERGY) 10 MG tablet Take 1 tablet (10 mg total) by mouth daily. 05/21/21   Jaynee Eagles, PA-C  furosemide (LASIX) 40 MG tablet Take 1 tablet (40 mg total) by mouth daily. 10/23/21 11/22/21  Melika Reder, Bernita Raisin, DO  ibuprofen (ADVIL) 800 MG tablet Take 1 tablet (800 mg total) by mouth every 8 (eight) hours as needed. 07/10/19   Zigmund Gottron, NP  losartan (COZAAR) 100 MG tablet Take 1 tablet (100 mg total) by mouth daily. 10/23/21   Raaga Maeder, Bernita Raisin, DO  metFORMIN (GLUCOPHAGE) 500 MG tablet Take 1 tablet (500 mg total) by mouth 2 (two) times daily with a meal. 10/23/21   Ilhan Debenedetto, Bernita Raisin, DO  naproxen (NAPROSYN) 500 MG tablet Take 1 tablet (500 mg total) by mouth 2 (two) times daily with a meal. 10/23/21   Michoel Kunin, Bernita Raisin, DO  potassium chloride (KLOR-CON M) 10 MEQ tablet Take 2 tablets (20 mEq total) by mouth daily. 10/23/21   Kayla Deshaies, Bernita Raisin, DO  promethazine-dextromethorphan (PROMETHAZINE-DM) 6.25-15 MG/5ML syrup Take 5 mLs by mouth at bedtime as needed for cough. 06/01/21   Raspet, Derry Skill, PA-C  Vitamin D, Ergocalciferol, (DRISDOL) 1.25 MG (50000 UNIT) CAPS capsule Take 50,000 Units by mouth once a week. 02/08/21   [provider]  losartan-hydrochlorothiazide (HYZAAR) 100-25 MG tablet Take 1 tablet by mouth daily. 10/24/19 01/01/20  Raylene Everts, MD  potassium chloride (KLOR-CON) 10  MEQ tablet Take by mouth. 12/27/18 09/03/19  [provider]  rosuvastatin (CRESTOR) 10 MG tablet Take 1 tablet (10 mg total) by mouth daily. 10/24/19 02/17/20  Raylene Everts, MD    Family History Family History  Problem Relation Age of Onset   Hypertension Mother    Heart failure Father     Social History Social History   Tobacco Use   Smoking status: Never   Smokeless tobacco: Never  Vaping Use   Vaping Use: Never used  Substance Use Topics   Alcohol use: Not Currently   Drug use: Never     Allergies    Patient has no known allergies.   Review of Systems Review of Systems  All other systems reviewed and are negative. Per HPI  Physical Exam Triage Vital Signs ED Triage Vitals  Enc Vitals Group     BP      Pulse      Resp      Temp      Temp src      SpO2      Weight      Height      Head Circumference      Peak Flow      Pain Score      Pain Loc      Pain Edu?      Excl. in [place]?    No data found.  Updated Vital Signs BP (!) 177/86 (BP Location: Left Arm)   Pulse 78   Temp 98.4 F (36.9 C) (Oral)   Resp 17   SpO2 100%   Visual Acuity Right Eye Distance:   Left Eye Distance:   Bilateral Distance:    Right Eye Near:   Left Eye Near:    Bilateral Near:     Physical Exam Constitutional:      General: He is not in acute distress.    Appearance: Normal appearance. He is not ill-appearing.  HENT:     Head: Normocephalic and atraumatic.     Mouth/Throat:   Eyes:     Conjunctiva/sclera: Conjunctivae normal.  Cardiovascular:     Rate and Rhythm: Normal rate.  Pulmonary:     Effort: Pulmonary effort is normal. No respiratory distress.  Musculoskeletal:     Cervical back: Normal range of motion. No tenderness.  Lymphadenopathy:     Cervical: No cervical adenopathy.  Skin:    General: Skin is warm and dry.  Neurological:     Mental Status: He is alert and oriented to person, place, and time.  Psychiatric:        Mood and Affect: Mood normal.        Behavior: Behavior normal.     UC Treatments / Results  Labs (all labs ordered are listed, but only abnormal results are displayed) Labs Reviewed - No data to display  EKG   Radiology No results found.  Procedures Procedures (including critical care time)  Medications Ordered in UC Medications - No data to display  Initial Impression / Assessment and Plan / UC Course  I have reviewed the triage vital signs and the nursing notes.  Pertinent labs & imaging results that were available during my  care of the patient were reviewed by me and considered in my medical decision making (see chart for details).     We will treat with Augmentin and naproxen for dental infection, recommended follow-up with a dentist.  Patient provided refills for the above listed medications for 1 month.  Recommended follow-up with primary care provider.  In regards to his blood pressure, he is asymptomatic today.  Given ED precautions, see AVS. Final Clinical Impressions(s) / UC Diagnoses   Final diagnoses:  Dental abscess  Essential hypertension  Type 2 diabetes mellitus without complication, without long-term current use of insulin ([place])     Discharge Instructions      I have sent a prescription to the pharmacy for an antibiotic for your tooth, but I recommend that you follow-up with a dentist as soon as possible for treatment of this.  I have also sent a prescription for an anti-inflammatory medicine, you can take this twice daily as needed with food.  Do not take this with ibuprofen, Advil, Aleve.  If you have severe worsening of your pain, especially if you start to have worsening swelling in your mouth, difficulty swallowing, you should be seen by medical provider right away.  Your blood pressure is elevated today.  Please make sure that you take your blood pressure medication and follow-up with your primary care provider to ensure that your blood pressure is well controlled.  If you are having difficulty breathing, chest pain, difficulty seeing, severe headache, difficulty urinating, severe back pain that is new, weakness on one side of your body or the other, numbness and tingling on one side of your body or the other, you should be seen at the emergency room right away.     ED Prescriptions     Medication Sig Dispense Auth. Provider   amLODipine (NORVASC) 10 MG tablet Take 1 tablet (10 mg total) by mouth daily. 30 tablet Vannesa Abair, Bernita Raisin, DO   furosemide (LASIX) 40 MG tablet Take 1 tablet  (40 mg total) by mouth daily. 30 tablet Yaeko Fazekas, Bernita Raisin, DO   losartan (COZAAR) 100 MG tablet Take 1 tablet (100 mg total) by mouth daily. 30 tablet Harith Mccadden, Bernita Raisin, DO   potassium chloride (KLOR-CON M) 10 MEQ tablet Take 2 tablets (20 mEq total) by mouth daily. 30 tablet Brynnleigh Mcelwee, Bernita Raisin, DO   metFORMIN (GLUCOPHAGE) 500 MG tablet Take 1 tablet (500 mg total) by mouth 2 (two) times daily with a meal. 60 tablet Carmencita Cusic J, DO   amoxicillin-clavulanate (AUGMENTIN) 875-125 MG tablet Take 1 tablet by mouth every 12 (twelve) hours. 28 tablet Jamar Weatherall, Bernita Raisin, DO   naproxen (NAPROSYN) 500 MG tablet Take 1 tablet (500 mg total) by mouth 2 (two) times daily with a meal. 30 tablet Alayne Estrella, Bernita Raisin, DO      PDMP not reviewed this encounter.   Cleophas Dunker, DO 10/23/21 1338

## 2021-10-23 NOTE — Discharge Instructions (Addendum)
I have sent a prescription to the pharmacy for an antibiotic for your tooth, but I recommend that you follow-up with a dentist as soon as possible for treatment of this.  I have also sent a prescription for an anti-inflammatory medicine, you can take this twice daily as needed with food.  Do not take this with ibuprofen, Advil, Aleve.  If you have severe worsening of your pain, especially if you start to have worsening swelling in your mouth, difficulty swallowing, you should be seen by medical provider right away.  Your blood pressure is elevated today.  Please make sure that you take your blood pressure medication and follow-up with your primary care provider to ensure that your blood pressure is well controlled.  If you are having difficulty breathing, chest pain, difficulty seeing, severe headache, difficulty urinating, severe back pain that is new, weakness on one side of your body or the other, numbness and tingling on one side of your body or the other, you should be seen at the emergency room right away.

## 2021-10-23 NOTE — ED Triage Notes (Signed)
Greater than one week h/o top left tooth pain. Has been taking tylenol and ibuprofen.

## 2021-10-30 ENCOUNTER — Other Ambulatory Visit: Payer: Self-pay | Admitting: Family Medicine

## 2021-12-21 ENCOUNTER — Encounter (HOSPITAL_COMMUNITY): Payer: Self-pay | Admitting: Emergency Medicine

## 2021-12-21 ENCOUNTER — Ambulatory Visit (HOSPITAL_COMMUNITY)
Admission: EM | Admit: 2021-12-21 | Discharge: 2021-12-21 | Disposition: A | Discharge status: Home / Self Care | Payer: BC Managed Care – PPO | Attending: Physician Assistant | Admitting: Physician Assistant

## 2021-12-21 DIAGNOSIS — I10 Essential (primary) hypertension: Secondary | ICD-10-CM

## 2021-12-21 DIAGNOSIS — Z760 Encounter for issue of repeat prescription: Secondary | ICD-10-CM | POA: Diagnosis not present

## 2021-12-21 MED ORDER — METFORMIN HCL 500 MG PO TABS: 500.0000 mg | ORAL_TABLET | Freq: Two times a day (BID) | ORAL | 0 refills | Status: DC

## 2021-12-21 MED ORDER — AMLODIPINE BESYLATE 10 MG PO TABS: 10.0000 mg | ORAL_TABLET | Freq: Every day | ORAL | 0 refills | Status: DC

## 2021-12-21 MED ORDER — FUROSEMIDE 40 MG PO TABS: 40.0000 mg | ORAL_TABLET | Freq: Every day | ORAL | 0 refills | Status: DC

## 2021-12-21 MED ORDER — POTASSIUM CHLORIDE CRYS ER 10 MEQ PO TBCR: 20.0000 meq | EXTENDED_RELEASE_TABLET | Freq: Every day | ORAL | 0 refills | Status: DC

## 2021-12-21 MED ORDER — LOSARTAN POTASSIUM 100 MG PO TABS: 100.0000 mg | ORAL_TABLET | Freq: Every day | ORAL | 0 refills | Status: DC

## 2021-12-21 NOTE — ED Triage Notes (Signed)
Pt needing medications refilled: Amlodipine  Furosamide Metformin Potassium Losartan Pt working with PCP about payment plan with them so he can be seen with them again.

## 2021-12-21 NOTE — Discharge Instructions (Signed)
Advised to restart medications in order to control the blood pressure, prediabetes, and reduce of lower extremity swelling. Advised to follow-up PCP or return to urgent care if symptoms fail to improve.

## 2021-12-21 NOTE — ED Provider Notes (Signed)
[facility]    CSN: [id number] Arrival date & time: 12/21/21  1416      History   Chief Complaint Chief Complaint  Patient presents with   Medication Refill    HPI Tyler Carroll is a 46 y.o. male.   46 year old male presents for medication refill.  Patient indicates that Tyler Carroll does have a PCP however Tyler Carroll is trying to get finances straightened out so that Tyler Carroll will be allowed to return for office visits at the PCP site.  Patient indicates that Tyler Carroll has ran out of his medicine and has been out for the past week.  Patient indicates Tyler Carroll is doing well other than having some increased swelling of the lower extremities due to not having his medication.  Patient desires to have medication refilled for his hypertension, prediabetes, and for his fluid retention.  Tyler Carroll relates Tyler Carroll is not having fever or chills, no shortness of breath or difficulty breathing.   Medication Refill   Past Medical History:  Diagnosis Date   Diabetes mellitus without complication ([place])    "pre-diabetic" 01/01/20   Hypertension     Patient Active Problem List   Diagnosis Date Noted   Peripheral edema 05/02/2019   Hypertension 05/02/2019    History reviewed. No pertinent surgical history.     Home Medications    Prior to Admission medications   Medication Sig Start Date End Date Taking? Authorizing Provider  amLODipine (NORVASC) 10 MG tablet Take 1 tablet (10 mg total) by mouth daily. 12/21/21   Nyoka Lint, PA-C  amoxicillin-clavulanate (AUGMENTIN) 875-125 MG tablet Take 1 tablet by mouth every 12 (twelve) hours. 10/23/21   Meccariello, Bernita Raisin, DO  benzonatate (TESSALON) 100 MG capsule Take 1-2 capsules (100-200 mg total) by mouth 3 (three) times daily as needed for cough. Patient not taking: Reported on 06/01/2021 05/21/21   Jaynee Eagles, PA-C  Blood Pressure Monitoring (BLOOD PRESSURE KIT) KIT Check BP as needed. 09/18/18   [provider]  cetirizine (ZYRTEC ALLERGY) 10 MG tablet Take 1  tablet (10 mg total) by mouth daily. 05/21/21   Jaynee Eagles, PA-C  furosemide (LASIX) 40 MG tablet Take 1 tablet (40 mg total) by mouth daily. 12/21/21 03/21/22  Nyoka Lint, PA-C  ibuprofen (ADVIL) 800 MG tablet Take 1 tablet (800 mg total) by mouth every 8 (eight) hours as needed. 07/10/19   Zigmund Gottron, NP  losartan (COZAAR) 100 MG tablet Take 1 tablet (100 mg total) by mouth daily. 12/21/21   Nyoka Lint, PA-C  metFORMIN (GLUCOPHAGE) 500 MG tablet Take 1 tablet (500 mg total) by mouth 2 (two) times daily with a meal. 12/21/21   Nyoka Lint, PA-C  naproxen (NAPROSYN) 500 MG tablet Take 1 tablet (500 mg total) by mouth 2 (two) times daily with a meal. 10/23/21   Meccariello, Bernita Raisin, DO  potassium chloride (KLOR-CON M) 10 MEQ tablet Take 2 tablets (20 mEq total) by mouth daily. 12/21/21   Nyoka Lint, PA-C  promethazine-dextromethorphan (PROMETHAZINE-DM) 6.25-15 MG/5ML syrup Take 5 mLs by mouth at bedtime as needed for cough. 06/01/21   Raspet, Derry Skill, PA-C  Vitamin D, Ergocalciferol, (DRISDOL) 1.25 MG (50000 UNIT) CAPS capsule Take 50,000 Units by mouth once a week. 02/08/21   [provider]  losartan-hydrochlorothiazide (HYZAAR) 100-25 MG tablet Take 1 tablet by mouth daily. 10/24/19 01/01/20  Raylene Everts, MD  potassium chloride (KLOR-CON) 10 MEQ tablet Take by mouth. 12/27/18 09/03/19  [provider]  rosuvastatin (CRESTOR) 10 MG tablet Take  1 tablet (10 mg total) by mouth daily. 10/24/19 02/17/20  Raylene Everts, MD    Family History Family History  Problem Relation Age of Onset   Hypertension Mother    Heart failure Father     Social History Social History   Tobacco Use   Smoking status: Never   Smokeless tobacco: Never  Vaping Use   Vaping Use: Never used  Substance Use Topics   Alcohol use: Not Currently   Drug use: Never     Allergies   Patient has no known allergies.   Review of Systems Review of Systems   Physical Exam Triage Vital  Signs ED Triage Vitals  Enc Vitals Group     BP 12/21/21 1449 (!) 179/78     Pulse Rate 12/21/21 1449 83     Resp 12/21/21 1449 20     Temp 12/21/21 1449 98.4 F (36.9 C)     Temp Source 12/21/21 1449 Oral     SpO2 12/21/21 1449 98 %     Weight --      Height --      Head Circumference --      Peak Flow --      Pain Score 12/21/21 1448 5     Pain Loc --      Pain Edu? --      Excl. in [place]? --    No data found.  Updated Vital Signs BP (!) 179/78 (BP Location: Right Arm)   Pulse 83   Temp 98.4 F (36.9 C) (Oral)   Resp 20   SpO2 98%   Visual Acuity Right Eye Distance:   Left Eye Distance:   Bilateral Distance:    Right Eye Near:   Left Eye Near:    Bilateral Near:     Physical Exam Constitutional:      Appearance: Normal appearance.  Cardiovascular:     Rate and Rhythm: Normal rate and regular rhythm.     Heart sounds: Normal heart sounds.  Pulmonary:     Effort: Pulmonary effort is normal.     Breath sounds: Normal breath sounds and air entry. No wheezing, rhonchi or rales.  Neurological:     Mental Status: Tyler Carroll is alert and oriented to person, place, and time.     Cranial Nerves: Cranial nerves 2-12 are intact.     Motor: Motor function is intact.      UC Treatments / Results  Labs (all labs ordered are listed, but only abnormal results are displayed) Labs Reviewed - No data to display  EKG   Radiology No results found.  Procedures Procedures (including critical care time)  Medications Ordered in UC Medications - No data to display  Initial Impression / Assessment and Plan / UC Course  I have reviewed the triage vital signs and the nursing notes.  Pertinent labs & imaging results that were available during my care of the patient were reviewed by me and considered in my medical decision making (see chart for details).    Plan: 1.  Patient has been given a 90-day refill on his medications until Tyler Carroll can reestablish care with his PCP. 2.   Patient advised to return to urgent care if symptoms fail to improve Final Clinical Impressions(s) / UC Diagnoses   Final diagnoses:  Medication refill  Primary hypertension     Discharge Instructions      Advised to restart medications in order to control the blood pressure, prediabetes, and reduce of lower extremity  swelling. Advised to follow-up PCP or return to urgent care if symptoms fail to improve.    ED Prescriptions     Medication Sig Dispense Auth. Provider   amLODipine (NORVASC) 10 MG tablet Take 1 tablet (10 mg total) by mouth daily. 90 tablet Nyoka Lint, PA-C   furosemide (LASIX) 40 MG tablet Take 1 tablet (40 mg total) by mouth daily. 90 tablet Nyoka Lint, PA-C   metFORMIN (GLUCOPHAGE) 500 MG tablet Take 1 tablet (500 mg total) by mouth 2 (two) times daily with a meal. 180 tablet Nyoka Lint, PA-C   potassium chloride (KLOR-CON M) 10 MEQ tablet Take 2 tablets (20 mEq total) by mouth daily. 180 tablet Nyoka Lint, PA-C   losartan (COZAAR) 100 MG tablet Take 1 tablet (100 mg total) by mouth daily. 90 tablet Nyoka Lint, PA-C      PDMP not reviewed this encounter.   Nyoka Lint, PA-C 12/21/21 [phone]

## 2022-04-26 IMAGING — DX DG CHEST 2V
2 series · 2 of 2 positions shown · non-contrast
Comparison: 05/17/2006.

CLINICAL DATA: cough, wheezing

EXAM:
CHEST - 2 VIEW

[chest pa]
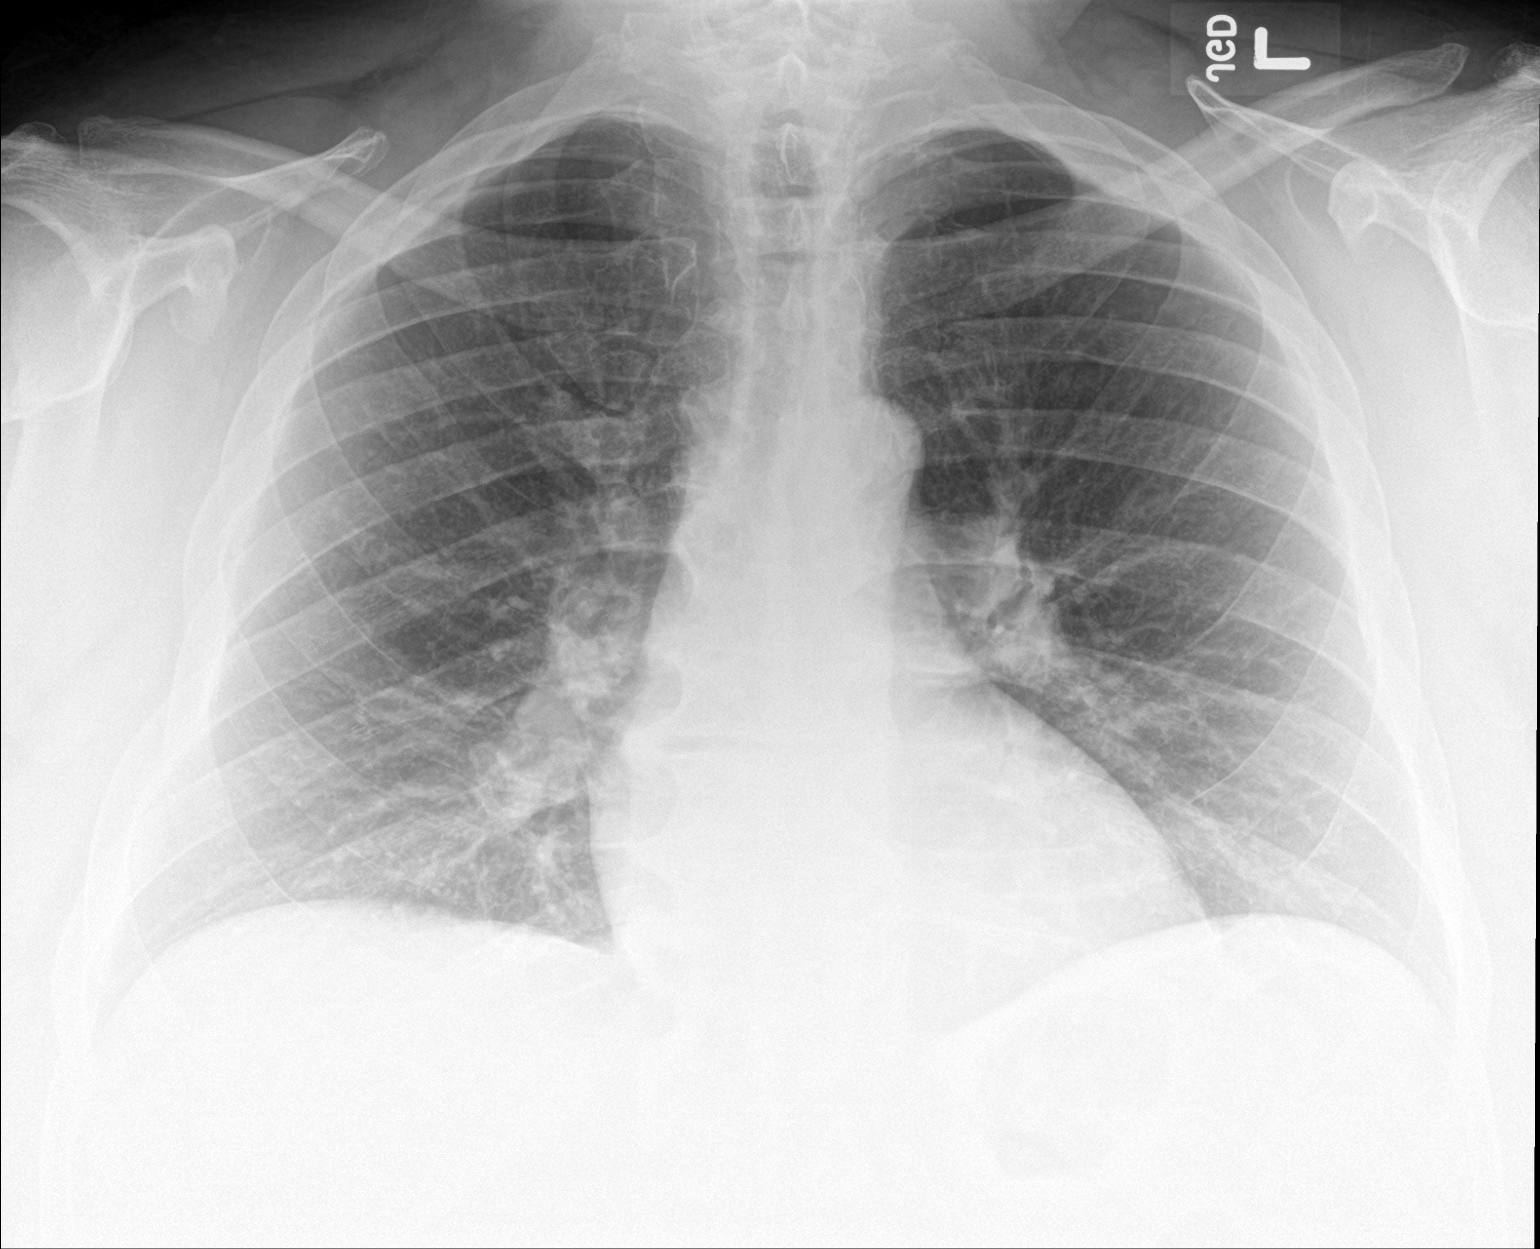

[chest lat]
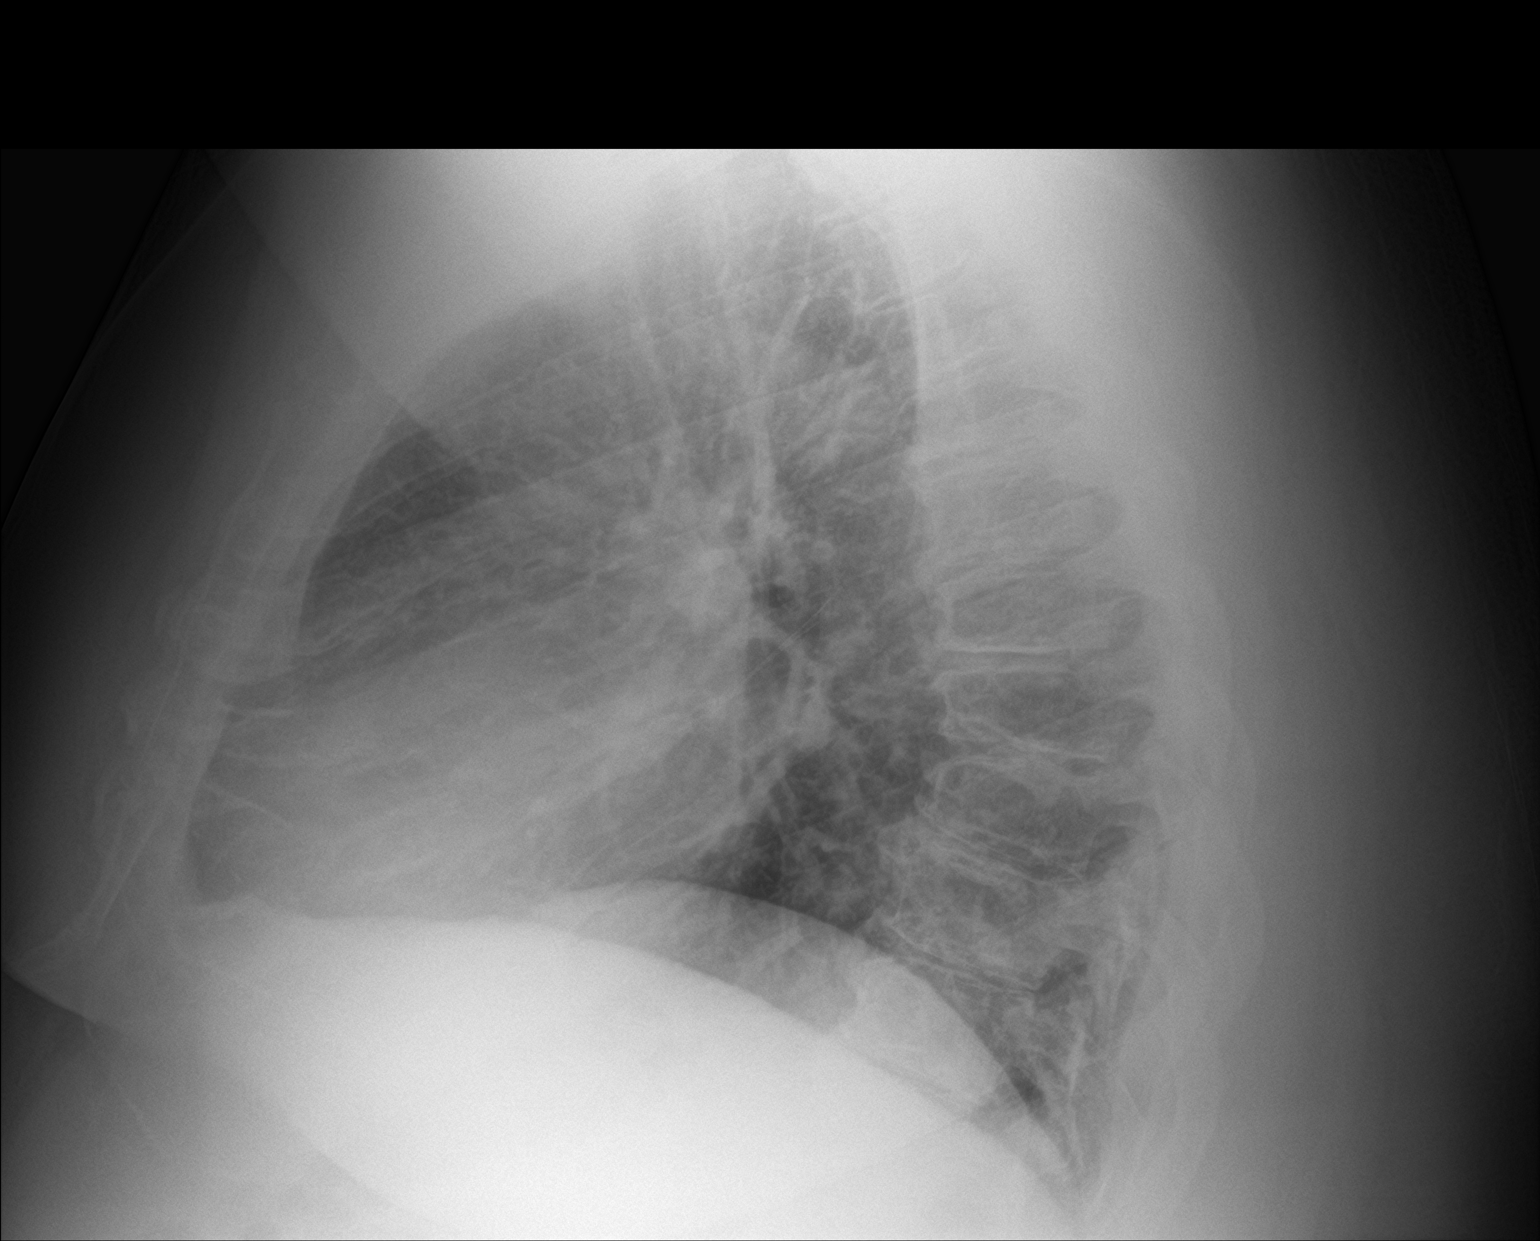

[2 of 2 positions shown; findings below may reference images not displayed]

FINDINGS: No consolidation. No visible pleural effusions or pneumothorax.
Cardiomediastinal silhouette is within normal limits and similar to
prior. Degenerative change of the thoracic spine.
IMPRESSION: No evidence of acute cardiopulmonary disease.

## 2022-04-27 ENCOUNTER — Ambulatory Visit (HOSPITAL_COMMUNITY)
Admission: EM | Admit: 2022-04-27 | Discharge: 2022-04-27 | Disposition: A | Discharge status: Home / Self Care | Payer: BC Managed Care – PPO | Attending: Emergency Medicine | Admitting: Emergency Medicine

## 2022-04-27 ENCOUNTER — Encounter (HOSPITAL_COMMUNITY): Payer: Self-pay

## 2022-04-27 DIAGNOSIS — E119 Type 2 diabetes mellitus without complications: Secondary | ICD-10-CM | POA: Diagnosis present

## 2022-04-27 DIAGNOSIS — I10 Essential (primary) hypertension: Secondary | ICD-10-CM | POA: Insufficient documentation

## 2022-04-27 DIAGNOSIS — E1159 Type 2 diabetes mellitus with other circulatory complications: Secondary | ICD-10-CM | POA: Diagnosis not present

## 2022-04-27 LAB — BASIC METABOLIC PANEL
Anion gap: 11 (ref 5–15)
BUN: 7 mg/dL (ref 6–20)
CO2: 23 mmol/L (ref 22–32)
Calcium: 9 mg/dL (ref 8.9–10.3)
Chloride: 106 mmol/L (ref 98–111)
Creatinine, Ser: 0.74 mg/dL (ref 0.61–1.24)
GFR, Estimated: >60 mL/min (ref >60)
Glucose, Bld: 104 mg/dL — ABNORMAL HIGH (ref 70–99)
Potassium: 3.8 mmol/L (ref 3.5–5.1)
Sodium: 140 mmol/L (ref 135–145)

## 2022-04-27 MED ORDER — FUROSEMIDE 40 MG PO TABS: 40.0000 mg | ORAL_TABLET | Freq: Every day | ORAL | 0 refills | Status: DC

## 2022-04-27 MED ORDER — METFORMIN HCL 500 MG PO TABS: 500.0000 mg | ORAL_TABLET | Freq: Two times a day (BID) | ORAL | 0 refills | Status: DC

## 2022-04-27 MED ORDER — POTASSIUM CHLORIDE CRYS ER 10 MEQ PO TBCR: 20.0000 meq | EXTENDED_RELEASE_TABLET | Freq: Every day | ORAL | 0 refills | Status: DC

## 2022-04-27 MED ORDER — LOSARTAN POTASSIUM 100 MG PO TABS: 100.0000 mg | ORAL_TABLET | Freq: Every day | ORAL | 0 refills | Status: DC

## 2022-04-27 MED ORDER — AMLODIPINE BESYLATE 10 MG PO TABS: 10.0000 mg | ORAL_TABLET | Freq: Every day | ORAL | 0 refills | Status: DC

## 2022-04-27 NOTE — ED Triage Notes (Signed)
Pt is here for med refill. Last dose of medication was 2 weeks ago.  Amlodipine  Furosemide KLOR--Con Losartan Metformin

## 2022-04-27 NOTE — ED Provider Notes (Signed)
[facility]    CSN: [id number] Arrival date & time: 04/27/22  1643      History   Chief Complaint Chief Complaint  Patient presents with   Hypertension   Medication Refill    HPI Tyler Carroll is a 46 y.o. male. Reports he is out of his hypertension medicine for the last 2 weeks.  Reports he is almost out of his metformin but still has some.  Requests refills.  Does not have a PCP.  Last refill of these medications was last July here at this urgent care.  Denies chest pain, dizziness, syncope, palpitations, headache.  Does report lower extremity edema, worse when he is out of his Lasix.   Hypertension  Medication Refill   Past Medical History:  Diagnosis Date   Diabetes mellitus without complication ([place])    "pre-diabetic" 01/01/20   Hypertension     Patient Active Problem List   Diagnosis Date Noted   Peripheral edema 05/02/2019   Hypertension 05/02/2019    History reviewed. No pertinent surgical history.     Home Medications    Prior to Admission medications   Medication Sig Start Date End Date Taking? Authorizing Provider  amLODipine (NORVASC) 10 MG tablet Take 1 tablet (10 mg total) by mouth daily. 04/27/22   Carvel Getting, NP  amoxicillin-clavulanate (AUGMENTIN) 875-125 MG tablet Take 1 tablet by mouth every 12 (twelve) hours. 10/23/21   Meccariello, Bernita Raisin, MD  benzonatate (TESSALON) 100 MG capsule Take 1-2 capsules (100-200 mg total) by mouth 3 (three) times daily as needed for cough. Patient not taking: Reported on 06/01/2021 05/21/21   Jaynee Eagles, PA-C  Blood Pressure Monitoring (BLOOD PRESSURE KIT) KIT Check BP as needed. 09/18/18   [provider]  cetirizine (ZYRTEC ALLERGY) 10 MG tablet Take 1 tablet (10 mg total) by mouth daily. 05/21/21   Jaynee Eagles, PA-C  furosemide (LASIX) 40 MG tablet Take 1 tablet (40 mg total) by mouth daily. 04/27/22 07/26/22  Carvel Getting, NP  ibuprofen (ADVIL) 800 MG tablet Take 1 tablet (800 mg  total) by mouth every 8 (eight) hours as needed. 07/10/19   Zigmund Gottron, NP  losartan (COZAAR) 100 MG tablet Take 1 tablet (100 mg total) by mouth daily. 04/27/22   Carvel Getting, NP  metFORMIN (GLUCOPHAGE) 500 MG tablet Take 1 tablet (500 mg total) by mouth 2 (two) times daily with a meal. 04/27/22   Carvel Getting, NP  naproxen (NAPROSYN) 500 MG tablet Take 1 tablet (500 mg total) by mouth 2 (two) times daily with a meal. 10/23/21   Meccariello, Bernita Raisin, MD  potassium chloride (KLOR-CON M) 10 MEQ tablet Take 2 tablets (20 mEq total) by mouth daily. 04/27/22   Carvel Getting, NP  promethazine-dextromethorphan (PROMETHAZINE-DM) 6.25-15 MG/5ML syrup Take 5 mLs by mouth at bedtime as needed for cough. 06/01/21   Raspet, Derry Skill, PA-C  Vitamin D, Ergocalciferol, (DRISDOL) 1.25 MG (50000 UNIT) CAPS capsule Take 50,000 Units by mouth once a week. 02/08/21   [provider]  losartan-hydrochlorothiazide (HYZAAR) 100-25 MG tablet Take 1 tablet by mouth daily. 10/24/19 01/01/20  Raylene Everts, MD  potassium chloride (KLOR-CON) 10 MEQ tablet Take by mouth. 12/27/18 09/03/19  [provider]  rosuvastatin (CRESTOR) 10 MG tablet Take 1 tablet (10 mg total) by mouth daily. 10/24/19 02/17/20  Raylene Everts, MD    Family History Family History  Problem Relation Age of Onset   Hypertension Mother  Heart failure Father     Social History Social History   Tobacco Use   Smoking status: Never   Smokeless tobacco: Never  Vaping Use   Vaping Use: Never used  Substance Use Topics   Alcohol use: Not Currently   Drug use: Never     Allergies   Patient has no known allergies.   Review of Systems Review of Systems   Physical Exam Triage Vital Signs ED Triage Vitals [04/27/22 1718]  Enc Vitals Group     BP (!) 200/94     Pulse      Resp      Temp      Temp src      SpO2 98 %     Weight      Height      Head Circumference      Peak Flow      Pain Score      Pain  Loc      Pain Edu?      Excl. in [place]?    No data found.  Updated Vital Signs BP (!) 180/110   SpO2 98%   Visual Acuity Right Eye Distance:   Left Eye Distance:   Bilateral Distance:    Right Eye Near:   Left Eye Near:    Bilateral Near:     Physical Exam Constitutional:      General: He is not in acute distress.    Appearance: Normal appearance. He is obese. He is not ill-appearing.  Cardiovascular:     Rate and Rhythm: Normal rate and regular rhythm.     Comments: No carotid bruit bilaterally  BLE edema, R is pitting edema.  Pulmonary:     Effort: Pulmonary effort is normal.     Breath sounds: Normal breath sounds.  Neurological:     Mental Status: He is alert.      UC Treatments / Results  Labs (all labs ordered are listed, but only abnormal results are displayed) Labs Reviewed  BASIC METABOLIC PANEL    EKG   Radiology No results found.  Procedures Procedures (including critical care time)  Medications Ordered in UC Medications - No data to display  Initial Impression / Assessment and Plan / UC Course  I have reviewed the triage vital signs and the nursing notes.  Pertinent labs & imaging results that were available during my care of the patient were reviewed by me and considered in my medical decision making (see chart for details).    Meds refilled as requested. Showed pt how to use [facility] open scheduling. BMP results pending.   Final Clinical Impressions(s) / UC Diagnoses   Final diagnoses:  Essential hypertension  Type 2 diabetes mellitus without complication, without long-term current use of insulin ([place])     Discharge Instructions      You will get a call if tests are abnormal, you will not get a call if tests are normal but you can check results in MyChart if you have a MyChart account.    If you do not have a primary care provider, and you want to find one at [facility], go to [email or web address], click on "primary care," click on  "new patients: schedule online," and choose either family medicine or internal medicine, and choose an appointment place and time that fits your schedule.     ED Prescriptions     Medication Sig Dispense Auth. Provider   amLODipine (NORVASC) 10 MG tablet Take 1 tablet (10  mg total) by mouth daily. 90 tablet Carvel Getting, NP   furosemide (LASIX) 40 MG tablet Take 1 tablet (40 mg total) by mouth daily. 90 tablet Carvel Getting, NP   losartan (COZAAR) 100 MG tablet Take 1 tablet (100 mg total) by mouth daily. 90 tablet Carvel Getting, NP   potassium chloride (KLOR-CON M) 10 MEQ tablet Take 2 tablets (20 mEq total) by mouth daily. 180 tablet Carvel Getting, NP   metFORMIN (GLUCOPHAGE) 500 MG tablet Take 1 tablet (500 mg total) by mouth 2 (two) times daily with a meal. 180 tablet Ras Kollman, Dionne Bucy, NP      PDMP not reviewed this encounter.   Carvel Getting, NP 04/27/22 [phone]

## 2022-04-27 NOTE — Discharge Instructions (Addendum)
You will get a call if tests are abnormal, you will not get a call if tests are normal but you can check results in MyChart if you have a MyChart account.    If you do not have a primary care provider, and you want to find one at [facility], go to [email or web address], click on "primary care," click on "new patients: schedule online," and choose either family medicine or internal medicine, and choose an appointment place and time that fits your schedule.

## 2022-09-08 ENCOUNTER — Encounter (HOSPITAL_COMMUNITY): Payer: Self-pay

## 2022-09-08 ENCOUNTER — Ambulatory Visit (HOSPITAL_COMMUNITY)
Admission: EM | Admit: 2022-09-08 | Discharge: 2022-09-08 | Disposition: A | Discharge status: Home / Self Care | Payer: BC Managed Care – PPO | Attending: Emergency Medicine | Admitting: Emergency Medicine

## 2022-09-08 DIAGNOSIS — I10 Essential (primary) hypertension: Secondary | ICD-10-CM

## 2022-09-08 DIAGNOSIS — E1100 Type 2 diabetes mellitus with hyperosmolarity without nonketotic hyperglycemic-hyperosmolar coma (NKHHC): Secondary | ICD-10-CM

## 2022-09-08 DIAGNOSIS — Z760 Encounter for issue of repeat prescription: Secondary | ICD-10-CM | POA: Diagnosis not present

## 2022-09-08 MED ORDER — AMLODIPINE BESYLATE 10 MG PO TABS: 10.0000 mg | ORAL_TABLET | Freq: Every day | ORAL | 0 refills | Status: DC

## 2022-09-08 MED ORDER — FUROSEMIDE 40 MG PO TABS: 40.0000 mg | ORAL_TABLET | Freq: Every day | ORAL | 0 refills | Status: DC

## 2022-09-08 MED ORDER — METFORMIN HCL 500 MG PO TABS: 500.0000 mg | ORAL_TABLET | Freq: Two times a day (BID) | ORAL | 0 refills | Status: DC

## 2022-09-08 MED ORDER — LOSARTAN POTASSIUM 100 MG PO TABS: 100.0000 mg | ORAL_TABLET | Freq: Every day | ORAL | 0 refills | Status: DC

## 2022-09-08 MED ORDER — POTASSIUM CHLORIDE CRYS ER 10 MEQ PO TBCR: 20.0000 meq | EXTENDED_RELEASE_TABLET | Freq: Every day | ORAL | 0 refills | Status: DC

## 2022-09-08 NOTE — Discharge Instructions (Signed)
Please continue to take all daily medication as prescribed without missing dosages, if your medication gets low and you have not been seen by primary doctor please return to urgent care for refill as we do not want you to miss or be without your medicine as this may cause complications  Monitor blood pressure daily and record until seen by your primary doctor, this will allow them to evaluate if medication is effective  If you are unable to get a primary doctor today, you may schedule an appointment at the local health department to at least get routine blood work associated with your chronic diseases and medications, should be free or at a lowered cost, it is on front page, call and schedule appointment  Primary care referral has been placed for you for someone reach out for follow-up to ensure that you have been able to locate Dr.

## 2022-09-08 NOTE — ED Provider Notes (Addendum)
[facility]    CSN: [id number] Arrival date & time: 09/08/22  1009      History   Chief Complaint Chief Complaint  Patient presents with   Medication Refill    HPI Tyler Carroll is a 47 y.o. male.   Patient presents for evaluation requesting medication refill.  Endorses due to home life he has delayed taking care of himself and finding provider.  Has been out of his medication for 2 to 3 weeks.  Takes the following below: metformin 500 mg bid  Potassium 10 2 tablets daily Furosemid 40 mg tablet,  Losartan 100 mg table Amlodipine 10 mg daily   Denies all symptoms of hypertensive urgency.   Past Medical History:  Diagnosis Date   Diabetes mellitus without complication    "pre-diabetic" 01/01/20   Hypertension     Patient Active Problem List   Diagnosis Date Noted   Peripheral edema 05/02/2019   Hypertension 05/02/2019    History reviewed. No pertinent surgical history.     Home Medications    Prior to Admission medications   Medication Sig Start Date End Date Taking? Authorizing Provider  amLODipine (NORVASC) 10 MG tablet Take 1 tablet (10 mg total) by mouth daily. 04/27/22  Yes Cathlyn Parsons, NP  metFORMIN (GLUCOPHAGE) 500 MG tablet Take 1 tablet (500 mg total) by mouth 2 (two) times daily with a meal. 04/27/22  Yes Cathlyn Parsons, NP  amoxicillin-clavulanate (AUGMENTIN) 875-125 MG tablet Take 1 tablet by mouth every 12 (twelve) hours. 10/23/21   Meccariello, Solmon Ice, MD  benzonatate (TESSALON) 100 MG capsule Take 1-2 capsules (100-200 mg total) by mouth 3 (three) times daily as needed for cough. Patient not taking: Reported on 06/01/2021 05/21/21   Wallis Bamberg, PA-C  Blood Pressure Monitoring (BLOOD PRESSURE KIT) KIT Check BP as needed. 09/18/18   [provider]  cetirizine (ZYRTEC ALLERGY) 10 MG tablet Take 1 tablet (10 mg total) by mouth daily. 05/21/21   Wallis Bamberg, PA-C  furosemide (LASIX) 40 MG tablet Take 1 tablet (40 mg total) by  mouth daily. 04/27/22 07/26/22  Cathlyn Parsons, NP  ibuprofen (ADVIL) 800 MG tablet Take 1 tablet (800 mg total) by mouth every 8 (eight) hours as needed. 07/10/19   Georgetta Haber, NP  losartan (COZAAR) 100 MG tablet Take 1 tablet (100 mg total) by mouth daily. 04/27/22   Cathlyn Parsons, NP  naproxen (NAPROSYN) 500 MG tablet Take 1 tablet (500 mg total) by mouth 2 (two) times daily with a meal. 10/23/21   Meccariello, Solmon Ice, MD  potassium chloride (KLOR-CON M) 10 MEQ tablet Take 2 tablets (20 mEq total) by mouth daily. 04/27/22   Cathlyn Parsons, NP  promethazine-dextromethorphan (PROMETHAZINE-DM) 6.25-15 MG/5ML syrup Take 5 mLs by mouth at bedtime as needed for cough. 06/01/21   Raspet, Noberto Retort, PA-C  Vitamin D, Ergocalciferol, (DRISDOL) 1.25 MG (50000 UNIT) CAPS capsule Take 50,000 Units by mouth once a week. 02/08/21   [provider]  losartan-hydrochlorothiazide (HYZAAR) 100-25 MG tablet Take 1 tablet by mouth daily. 10/24/19 01/01/20  Eustace Moore, MD  potassium chloride (KLOR-CON) 10 MEQ tablet Take by mouth. 12/27/18 09/03/19  [provider]  rosuvastatin (CRESTOR) 10 MG tablet Take 1 tablet (10 mg total) by mouth daily. 10/24/19 02/17/20  Eustace Moore, MD    Family History Family History  Problem Relation Age of Onset   Hypertension Mother    Heart failure Father     Social  History Social History   Tobacco Use   Smoking status: Never   Smokeless tobacco: Never  Vaping Use   Vaping Use: Never used  Substance Use Topics   Alcohol use: Not Currently   Drug use: Never     Allergies   Patient has no known allergies.   Review of Systems Review of Systems   Physical Exam Triage Vital Signs ED Triage Vitals [09/08/22 1140]  Enc Vitals Group     BP (!) 202/80     Pulse Rate 83     Resp 18     Temp 98.1 F (36.7 C)     Temp Source Oral     SpO2 97 %     Weight      Height      Head Circumference      Peak Flow      Pain Score      Pain  Loc      Pain Edu?      Excl. in [place]?    No data found.  Updated Vital Signs BP (!) 202/80 (BP Location: Right Arm)   Pulse 83   Temp 98.1 F (36.7 C) (Oral)   Resp 18   SpO2 97%   Visual Acuity Right Eye Distance:   Left Eye Distance:   Bilateral Distance:    Right Eye Near:   Left Eye Near:    Bilateral Near:     Physical Exam Constitutional:      Appearance: Normal appearance.  Eyes:     Extraocular Movements: Extraocular movements intact.  Pulmonary:     Effort: Pulmonary effort is normal.  Neurological:     Mental Status: He is alert and oriented to person, place, and time. Mental status is at baseline.      UC Treatments / Results  Labs (all labs ordered are listed, but only abnormal results are displayed) Labs Reviewed - No data to display  EKG   Radiology No results found.  Procedures Procedures (including critical care time)  Medications Ordered in UC Medications - No data to display  Initial Impression / Assessment and Plan / UC Course  I have reviewed the triage vital signs and the nursing notes.  Pertinent labs & imaging results that were available during my care of the patient were reviewed by me and considered in my medical decision making (see chart for details). Medication refill, essential hypertension, type 2 diabetes Elevated blood pressure reading in office with diagnosis of hypertension  Blood pressure in triage 202/80, no signs of hypertensive urgency, stable for outpatient management, has not been taking medications, medication refilled for 3 months, stressed importance of finding a primary doctor to monitor chronic illnesses as well as complete routine blood work, given information to the local health department and PCP referral placed Final Clinical Impressions(s) / UC Diagnoses   Final diagnoses:  None   Discharge Instructions   None    ED Prescriptions   None    PDMP not reviewed this encounter.   Valinda HoarWhite, Ayaan Ringle R,  NP 09/08/22 [id number]1226    Aleira Deiter R, [place]NP 09/22/22 [phone]

## 2022-09-08 NOTE — ED Triage Notes (Signed)
Pt presents to the office for medication refills on 4 different medications. Pt stated he has been out of medication for several weeks.  Denies any headaches,blurred vision and SOB.  Pt reports increase fatigue.

## 2022-09-25 ENCOUNTER — Ambulatory Visit: Payer: BC Managed Care – PPO | Admitting: Family Medicine

## 2022-10-10 ENCOUNTER — Ambulatory Visit: Payer: BC Managed Care – PPO | Admitting: Family Medicine

## 2022-11-03 ENCOUNTER — Encounter: Payer: Self-pay | Admitting: Family Medicine

## 2022-11-03 ENCOUNTER — Ambulatory Visit (INDEPENDENT_AMBULATORY_CARE_PROVIDER_SITE_OTHER): Payer: BC Managed Care – PPO | Admitting: Family Medicine

## 2022-11-03 VITALS — BP 154/91 | HR 74 | Temp 97.9°F | Ht 70.0 in | Wt >= 6400 oz

## 2022-11-03 DIAGNOSIS — E876 Hypokalemia: Secondary | ICD-10-CM

## 2022-11-03 DIAGNOSIS — M545 Low back pain, unspecified: Secondary | ICD-10-CM

## 2022-11-03 DIAGNOSIS — E669 Obesity, unspecified: Secondary | ICD-10-CM | POA: Insufficient documentation

## 2022-11-03 DIAGNOSIS — G8929 Other chronic pain: Secondary | ICD-10-CM | POA: Insufficient documentation

## 2022-11-03 DIAGNOSIS — M25561 Pain in right knee: Secondary | ICD-10-CM

## 2022-11-03 DIAGNOSIS — I10 Essential (primary) hypertension: Secondary | ICD-10-CM | POA: Diagnosis not present

## 2022-11-03 DIAGNOSIS — E559 Vitamin D deficiency, unspecified: Secondary | ICD-10-CM | POA: Insufficient documentation

## 2022-11-03 DIAGNOSIS — R7303 Prediabetes: Secondary | ICD-10-CM | POA: Insufficient documentation

## 2022-11-03 DIAGNOSIS — R600 Localized edema: Secondary | ICD-10-CM

## 2022-11-03 DIAGNOSIS — E785 Hyperlipidemia, unspecified: Secondary | ICD-10-CM | POA: Insufficient documentation

## 2022-11-03 HISTORY — DX: Hypokalemia: E87.6

## 2022-11-03 MED ORDER — HYDROCHLOROTHIAZIDE 25 MG PO TABS
25.0000 mg | ORAL_TABLET | Freq: Every day | ORAL | 0 refills | Status: DC
Start: 2022-11-03 — End: 2022-11-26

## 2022-11-03 MED ORDER — BLOOD PRESSURE KIT KIT: 1.0000 | PACK | Freq: Every day | 0 refills | Status: DC

## 2022-11-03 NOTE — Assessment & Plan Note (Signed)
Difficulty initiating weight loss, BMI of 70.  Plan:  Provide dietary and physical activity counseling. Discuss weight management medications at future visits. Possible referral to a dietitian or bariatric specialist. Consider screening for OSA

## 2022-11-03 NOTE — Assessment & Plan Note (Signed)
Managed with metformin 500 mg twice daily.  Plan:  Continue metformin. Include HbA1c and fasting glucose in next blood work. Monitor for progression to diabetes.

## 2022-11-03 NOTE — Progress Notes (Signed)
Assessment/Plan:   Problem List Items Addressed This Visit       Cardiovascular and Mediastinum   Hypertension    Tyler Carroll has poorly controlled hypertension with BP 147/83 mmHg. He stopped amlodipine due to fatigue and is now on losartan and Lasix.  Possibly worsened by NSAID use.  Plan: Add hydrochlorothiazide. Encourage home BP monitoring. Schedule fasting blood work in three weeks. Follow-up in one month. Discuss lifestyle modifications for weight loss.      Relevant Medications   Blood Pressure Monitoring (BLOOD PRESSURE KIT) KIT   hydrochlorothiazide (HYDRODIURIL) 25 MG tablet   Other Relevant Orders   TSH   Lipid panel   Microalbumin / creatinine urine ratio   Urinalysis, Routine w reflex microscopic   CBC with Differential/Platelet   Comprehensive metabolic panel     Other   Peripheral edema    Chronic bilateral leg swelling, managed with compression stockings and Lasix. Previous Lexi scans showed mildly reduced ejection fraction.  Plan:  Continue Lasix. Add BNP testing. Consider leg ultrasound if swelling persists/worsens. Reinforce use of compression stockings and leg elevation.      Relevant Orders   Brain natriuretic peptide   Chronic low back pain without sciatica - Primary    Chronic pain managed with ibuprofen 800 mg three times a day, likely related to past injuries.  Plan:  Discuss reducing ibuprofen. Explore alternative pain management options. Consider orthopedic referral for persistent pain.      Relevant Medications   Acetaminophen (TYLENOL 8 HOUR PO)   Chronic pain of right knee   Class 3 severe obesity with serious comorbidity in adult [facility])    Difficulty initiating weight loss, BMI of 70.  Plan:  Provide dietary and physical activity counseling. Discuss weight management medications at future visits. Possible referral to a dietitian or bariatric specialist. Consider screening for OSA      Relevant Orders   CBC with  Differential/Platelet   Comprehensive metabolic panel   Vitamin D deficiency   Relevant Orders   Vitamin D 1,25 dihydroxy   Hypokalemia   Relevant Orders   Comprehensive metabolic panel   Prediabetes    Managed with metformin 500 mg twice daily.  Plan:  Continue metformin. Include HbA1c and fasting glucose in next blood work. Monitor for progression to diabetes.      Relevant Orders   Hemoglobin A1c   Hyperlipidemia   Relevant Medications   hydrochlorothiazide (HYDRODIURIL) 25 MG tablet   Other Relevant Orders   Lipid panel    Medications Discontinued During This Encounter  Medication Reason   amLODipine (NORVASC) 10 MG tablet    amoxicillin-clavulanate (AUGMENTIN) 875-125 MG tablet    benzonatate (TESSALON) 100 MG capsule    cetirizine (ZYRTEC ALLERGY) 10 MG tablet    naproxen (NAPROSYN) 500 MG tablet    promethazine-dextromethorphan (PROMETHAZINE-DM) 6.25-15 MG/5ML syrup    Blood Pressure Monitoring (BLOOD PRESSURE KIT) KIT Reorder    Return in about 1 month (around 12/03/2022) for BP, fasting labs 1 week before appointment.    Subjective:   Encounter date: 11/03/2022  Tyler Carroll is a 47 y.o. male who has Peripheral edema; Hypertension; Chronic low back pain without sciatica; Chronic pain of right knee; Class 3 severe obesity with serious comorbidity in adult [facility]); Vitamin D deficiency; Hypokalemia; Prediabetes; and Hyperlipidemia on their problem list..   He  has a past medical history of Diabetes mellitus without complication (HCC) and Hypertension..   CHIEF COMPLAINT: Patient presents to establish care, request medication refills,  and discuss leg swelling.  HISTORY OF PRESENT ILLNESS:  Establishing Care and Medication Refills: The patient has a history of high blood pressure and prediabetes. He previously sought care at [facility] before the primary care provider retired. He was on amlodipine but discontinued it due to fatigue and general malaise. He  currently takes losartan and Lasix. Tyler Carroll has a history of elevated blood pressure recorded at 202/80 about a month ago.He is on losartan for blood pressure and Lasix for both blood pressure and leg swelling.  Leg Swelling: Tyler Carroll reports chronic leg swelling managed with compression stockings and Lasix 40 mg once daily. He wears compression stockings but finds limited relief. He does not experience chest pain or shortness of breath but is concerned due to his wife's observations.  Joint pain. He also takes ibuprofen 800 mg thrice daily for chronic back and knee pain, which he attributes to a prior job at [employer] and an incident three years ago at a bowling party, respectively.  Weight Management: Tyler Carroll is trying to lose weight, expressing difficulty in getting started. His BMI is at 70.  Review of Systems  Constitutional:  Negative for chills, diaphoresis, fever, malaise/fatigue and weight loss.  HENT:  Negative for congestion, ear discharge, ear pain and hearing loss.   Eyes:  Negative for blurred vision, double vision, photophobia, pain, discharge and redness.  Respiratory:  Negative for cough, sputum production, shortness of breath and wheezing.   Cardiovascular:  Positive for leg swelling. Negative for chest pain and palpitations.  Gastrointestinal:  Negative for abdominal pain, blood in stool, constipation, diarrhea, heartburn, melena, nausea and vomiting.  Genitourinary:  Negative for dysuria, flank pain, frequency, hematuria and urgency.  Musculoskeletal:  Positive for back pain and joint pain (right knee). Negative for myalgias.  Skin:  Negative for itching and rash.  Neurological:  Negative for dizziness, tingling, tremors, speech change, seizures, loss of consciousness, weakness and headaches.  Endo/Heme/Allergies:  Negative for polydipsia.  Psychiatric/Behavioral:  Negative for depression, hallucinations, memory loss, substance abuse and suicidal ideas. The patient does not have  insomnia.   All other systems reviewed and are negative.   History reviewed. No pertinent surgical history.  Outpatient Medications Prior to Visit  Medication Sig Dispense Refill   Acetaminophen (TYLENOL 8 HOUR PO) Take by mouth.     furosemide (LASIX) 40 MG tablet Take 1 tablet (40 mg total) by mouth daily. 90 tablet 0   ibuprofen (ADVIL) 800 MG tablet Take 1 tablet (800 mg total) by mouth every 8 (eight) hours as needed. 21 tablet 0   losartan (COZAAR) 100 MG tablet Take 1 tablet (100 mg total) by mouth daily. 90 tablet 0   metFORMIN (GLUCOPHAGE) 500 MG tablet Take 1 tablet (500 mg total) by mouth 2 (two) times daily with a meal. 180 tablet 0   potassium chloride (KLOR-CON M) 10 MEQ tablet Take 2 tablets (20 mEq total) by mouth daily. 180 tablet 0   Blood Pressure Monitoring (BLOOD PRESSURE KIT) KIT Check BP as needed.     Vitamin D, Ergocalciferol, (DRISDOL) 1.25 MG (50000 UNIT) CAPS capsule Take 50,000 Units by mouth once a week. (Patient not taking: Reported on 11/03/2022)     amLODipine (NORVASC) 10 MG tablet Take 1 tablet (10 mg total) by mouth daily. (Patient not taking: Reported on 11/03/2022) 90 tablet 0   amoxicillin-clavulanate (AUGMENTIN) 875-125 MG tablet Take 1 tablet by mouth every 12 (twelve) hours. (Patient not taking: Reported on 11/03/2022) 28 tablet 0   benzonatate (TESSALON)  100 MG capsule Take 1-2 capsules (100-200 mg total) by mouth 3 (three) times daily as needed for cough. (Patient not taking: Reported on 06/01/2021) 60 capsule 0   cetirizine (ZYRTEC ALLERGY) 10 MG tablet Take 1 tablet (10 mg total) by mouth daily. (Patient not taking: Reported on 11/03/2022) 30 tablet 0   naproxen (NAPROSYN) 500 MG tablet Take 1 tablet (500 mg total) by mouth 2 (two) times daily with a meal. (Patient not taking: Reported on 11/03/2022) 30 tablet 0   promethazine-dextromethorphan (PROMETHAZINE-DM) 6.25-15 MG/5ML syrup Take 5 mLs by mouth at bedtime as needed for cough. (Patient not taking:  Reported on 11/03/2022) 100 mL 0   No facility-administered medications prior to visit.    Family History  Problem Relation Age of Onset   Hypertension Mother    Heart failure Father     Social History   Socioeconomic History   Marital status: Single    Spouse name: Not on file   Number of children: Not on file   Years of education: Not on file   Highest education level: Not on file  Occupational History   Not on file  Tobacco Use   Smoking status: Never    Passive exposure: Never   Smokeless tobacco: Never  Vaping Use   Vaping Use: Never used  Substance and Sexual Activity   Alcohol use: Not Currently   Drug use: Never   Sexual activity: Yes    Birth control/protection: None    Comment: Married  Other Topics Concern   Not on file  Social History Narrative   Not on file   Social Determinants of Health   Financial Resource Strain: Not on file  Food Insecurity: Not on file  Transportation Needs: Not on file  Physical Activity: Not on file  Stress: Not on file  Social Connections: Not on file  Intimate Partner Violence: Not on file                                                                                                  Objective:  Physical Exam: BP (!) 154/91 (BP Location: Left Arm, Patient Position: Sitting, Cuff Size: Large)   Pulse 74   Temp 97.9 F (36.6 C) (Temporal)   Ht 5\' 10"  (1.778 m)   Wt (!) 489 lb 9.6 oz (222.1 kg)   SpO2 97%   BMI 70.25 kg/m     Physical Exam Constitutional:      Appearance: Normal appearance.  HENT:     Head: Normocephalic and atraumatic.     Right Ear: Hearing normal.     Left Ear: Hearing normal.     Nose: Nose normal.  Eyes:     General: No scleral icterus.       Right eye: No discharge.        Left eye: No discharge.     Extraocular Movements: Extraocular movements intact.  Cardiovascular:     Rate and Rhythm: Normal rate and regular rhythm.     Heart sounds: Normal heart sounds.  Pulmonary:      Effort: Pulmonary effort is normal.  Breath sounds: Normal breath sounds.  Abdominal:     Palpations: Abdomen is soft.     Tenderness: There is no abdominal tenderness.  Musculoskeletal:     Right lower leg: 2+ Pitting Edema present.     Left lower leg: 2+ Pitting Edema present.  Skin:    General: Skin is warm.     Findings: No rash.  Neurological:     General: No focal deficit present.     Mental Status: He is alert.     Cranial Nerves: No cranial nerve deficit.  Psychiatric:        Mood and Affect: Mood normal.        Behavior: Behavior normal.        Thought Content: Thought content normal.        Judgment: Judgment normal.     No results found.  No results found for this or any previous visit (from the past 2160 hour(s)).      Garner Nash, MD, MS

## 2022-11-03 NOTE — Assessment & Plan Note (Signed)
Chronic pain managed with ibuprofen 800 mg three times a day, likely related to past injuries.  Plan:  Discuss reducing ibuprofen. Explore alternative pain management options. Consider orthopedic referral for persistent pain.

## 2022-11-03 NOTE — Assessment & Plan Note (Signed)
Chronic bilateral leg swelling, managed with compression stockings and Lasix. Previous Lexi scans showed mildly reduced ejection fraction.  Plan:  Continue Lasix. Add BNP testing. Consider leg ultrasound if swelling persists/worsens. Reinforce use of compression stockings and leg elevation.

## 2022-11-03 NOTE — Assessment & Plan Note (Addendum)
Tyler Carroll has poorly controlled hypertension with BP 147/83 mmHg. He stopped amlodipine due to fatigue and is now on losartan and Lasix.  Possibly worsened by NSAID use.  Plan: Add hydrochlorothiazide. Encourage home BP monitoring. Schedule fasting blood work in three weeks. Follow-up in one month. Discuss lifestyle modifications for weight loss.

## 2022-11-03 NOTE — Patient Instructions (Signed)
Start the new blood pressure medication, hydrochlorothiazide, as directed. Monitor how you feel and report any side effects. Continue to monitor your blood pressure at home using a blood pressure cuff. Aim to check it regularly and record the readings. Try to incorporate lifestyle changes like weight loss which can help manage both blood pressure and prediabetes. If you need guidance, consult with a nutritionist or consider weight management programs.

## 2022-11-26 ENCOUNTER — Other Ambulatory Visit: Payer: Self-pay | Admitting: Family Medicine

## 2022-11-26 DIAGNOSIS — I10 Essential (primary) hypertension: Secondary | ICD-10-CM

## 2022-11-26 NOTE — Telephone Encounter (Signed)
Chart supports rx. Last OV: 11/03/2022 Next OV: 12/09/2022

## 2022-12-02 ENCOUNTER — Other Ambulatory Visit: Payer: BC Managed Care – PPO

## 2022-12-03 ENCOUNTER — Other Ambulatory Visit (INDEPENDENT_AMBULATORY_CARE_PROVIDER_SITE_OTHER): Payer: BC Managed Care – PPO

## 2022-12-03 DIAGNOSIS — E782 Mixed hyperlipidemia: Secondary | ICD-10-CM | POA: Diagnosis not present

## 2022-12-03 DIAGNOSIS — I10 Essential (primary) hypertension: Secondary | ICD-10-CM | POA: Diagnosis not present

## 2022-12-03 DIAGNOSIS — R600 Localized edema: Secondary | ICD-10-CM

## 2022-12-03 DIAGNOSIS — E785 Hyperlipidemia, unspecified: Secondary | ICD-10-CM

## 2022-12-03 DIAGNOSIS — R7303 Prediabetes: Secondary | ICD-10-CM

## 2022-12-03 DIAGNOSIS — Z789 Other specified health status: Secondary | ICD-10-CM

## 2022-12-03 DIAGNOSIS — E876 Hypokalemia: Secondary | ICD-10-CM

## 2022-12-03 DIAGNOSIS — E559 Vitamin D deficiency, unspecified: Secondary | ICD-10-CM

## 2022-12-03 LAB — CBC WITH DIFFERENTIAL/PLATELET
Basophils Absolute: 0 10*3/uL (ref 0.0–0.1)
Basophils Relative: 0.4 % (ref 0.0–3.0)
Eosinophils Absolute: 0.3 10*3/uL (ref 0.0–0.7)
Eosinophils Relative: 3.3 % (ref 0.0–5.0)
HCT: 38.4 % — ABNORMAL LOW (ref 39.0–52.0)
Hemoglobin: 12.5 g/dL — ABNORMAL LOW (ref 13.0–17.0)
Lymphocytes Relative: 28.7 % (ref 12.0–46.0)
Lymphs Abs: 2.3 10*3/uL (ref 0.7–4.0)
MCHC: 32.7 g/dL (ref 30.0–36.0)
MCV: 81.7 fl (ref 78.0–100.0)
Monocytes Absolute: 0.6 10*3/uL (ref 0.1–1.0)
Monocytes Relative: 7.4 % (ref 3.0–12.0)
Neutro Abs: 4.8 10*3/uL (ref 1.4–7.7)
Neutrophils Relative %: 60.2 % (ref 43.0–77.0)
Platelets: 346 10*3/uL (ref 150.0–400.0)
RBC: 4.7 Mil/uL (ref 4.22–5.81)
RDW: 14.8 % (ref 11.5–15.5)
WBC: 7.9 10*3/uL (ref 4.0–10.5)

## 2022-12-03 LAB — URINALYSIS, ROUTINE W REFLEX MICROSCOPIC
Bilirubin Urine: NEGATIVE
Hgb urine dipstick: NEGATIVE
Ketones, ur: NEGATIVE
Leukocytes,Ua: NEGATIVE
Nitrite: NEGATIVE
RBC / HPF: NONE SEEN (ref 0–?)
Specific Gravity, Urine: >=1.03 — AB (ref 1.000–1.030)
Total Protein, Urine: NEGATIVE
Urine Glucose: NEGATIVE
Urobilinogen, UA: 0.2 (ref 0.0–1.0)
pH: 6 (ref 5.0–8.0)

## 2022-12-03 LAB — MICROALBUMIN / CREATININE URINE RATIO
Creatinine,U: 223.3 mg/dL
Microalb Creat Ratio: 1.1 mg/g (ref 0.0–30.0)
Microalb, Ur: 2.4 mg/dL — ABNORMAL HIGH (ref 0.0–1.9)

## 2022-12-03 LAB — COMPREHENSIVE METABOLIC PANEL
ALT: 21 U/L (ref 0–53)
AST: 18 U/L (ref 0–37)
Albumin: 3.7 g/dL (ref 3.5–5.2)
Alkaline Phosphatase: 78 U/L (ref 39–117)
BUN: 13 mg/dL (ref 6–23)
CO2: 31 mEq/L (ref 19–32)
Calcium: 8.9 mg/dL (ref 8.4–10.5)
Chloride: 99 mEq/L (ref 96–112)
Creatinine, Ser: 0.76 mg/dL (ref 0.40–1.50)
GFR: 105.92 mL/min (ref >60.00)
Glucose, Bld: 115 mg/dL — ABNORMAL HIGH (ref 70–99)
Potassium: 3.1 mEq/L — ABNORMAL LOW (ref 3.5–5.1)
Sodium: 139 mEq/L (ref 135–145)
Total Bilirubin: 0.4 mg/dL (ref 0.2–1.2)
Total Protein: 6.9 g/dL (ref 6.0–8.3)

## 2022-12-03 LAB — LIPID PANEL
Cholesterol: 233 mg/dL — ABNORMAL HIGH (ref 0–200)
HDL: 38.6 mg/dL — ABNORMAL LOW (ref >39.00)
LDL Cholesterol: 169 mg/dL — ABNORMAL HIGH (ref 0–99)
NonHDL: 194.14
Total CHOL/HDL Ratio: 6
Triglycerides: 126 mg/dL (ref 0.0–149.0)
VLDL: 25.2 mg/dL (ref 0.0–40.0)

## 2022-12-03 LAB — TSH: TSH: 1.34 u[IU]/mL (ref 0.35–5.50)

## 2022-12-03 LAB — BRAIN NATRIURETIC PEPTIDE: Pro B Natriuretic peptide (BNP): 6 pg/mL (ref 0.0–100.0)

## 2022-12-03 LAB — HEMOGLOBIN A1C: Hgb A1c MFr Bld: 6.2 % (ref 4.6–6.5)

## 2022-12-05 DIAGNOSIS — Z789 Other specified health status: Secondary | ICD-10-CM | POA: Insufficient documentation

## 2022-12-05 MED ORDER — REPATHA SURECLICK 140 MG/ML ~~LOC~~ SOAJ
140.00 mg | SUBCUTANEOUS | 1 refills | Status: AC
Start: 2022-12-05 — End: 2023-03-05

## 2022-12-05 MED ORDER — POTASSIUM CHLORIDE CRYS ER 20 MEQ PO TBCR
20.0000 meq | EXTENDED_RELEASE_TABLET | Freq: Two times a day (BID) | ORAL | 2 refills | Status: DC
Start: 2022-12-05 — End: 2023-05-22

## 2022-12-05 NOTE — Addendum Note (Signed)
Addended by: Garnette Gunner on: 12/05/2022 09:48 AM   Modules accepted: Orders

## 2022-12-08 LAB — VITAMIN D 1,25 DIHYDROXY
Vitamin D 1, 25 (OH)2 Total: 32 pg/mL (ref 18–72)
Vitamin D2 1, 25 (OH)2: <8 pg/mL
Vitamin D3 1, 25 (OH)2: 32 pg/mL

## 2022-12-09 ENCOUNTER — Encounter: Payer: Self-pay | Admitting: Family Medicine

## 2022-12-09 ENCOUNTER — Ambulatory Visit: Payer: BC Managed Care – PPO | Admitting: Family Medicine

## 2022-12-09 VITALS — BP 125/77 | HR 95 | Temp 98.5°F | Wt >= 6400 oz

## 2022-12-09 DIAGNOSIS — I10 Essential (primary) hypertension: Secondary | ICD-10-CM | POA: Diagnosis not present

## 2022-12-09 DIAGNOSIS — R601 Generalized edema: Secondary | ICD-10-CM

## 2022-12-09 DIAGNOSIS — Z1159 Encounter for screening for other viral diseases: Secondary | ICD-10-CM

## 2022-12-09 DIAGNOSIS — D649 Anemia, unspecified: Secondary | ICD-10-CM

## 2022-12-09 DIAGNOSIS — R7303 Prediabetes: Secondary | ICD-10-CM

## 2022-12-09 DIAGNOSIS — R600 Localized edema: Secondary | ICD-10-CM | POA: Diagnosis not present

## 2022-12-09 DIAGNOSIS — Z0000 Encounter for general adult medical examination without abnormal findings: Secondary | ICD-10-CM | POA: Diagnosis not present

## 2022-12-09 DIAGNOSIS — E876 Hypokalemia: Secondary | ICD-10-CM | POA: Diagnosis not present

## 2022-12-09 DIAGNOSIS — G4733 Obstructive sleep apnea (adult) (pediatric): Secondary | ICD-10-CM | POA: Diagnosis not present

## 2022-12-09 DIAGNOSIS — Z789 Other specified health status: Secondary | ICD-10-CM

## 2022-12-09 DIAGNOSIS — Z1211 Encounter for screening for malignant neoplasm of colon: Secondary | ICD-10-CM

## 2022-12-09 DIAGNOSIS — E785 Hyperlipidemia, unspecified: Secondary | ICD-10-CM

## 2022-12-09 HISTORY — DX: Anemia, unspecified: D64.9

## 2022-12-09 LAB — FERRITIN: Ferritin: 125.4 ng/mL (ref 22.0–322.0)

## 2022-12-09 LAB — CBC WITH DIFFERENTIAL/PLATELET
Basophils Absolute: 0 10*3/uL (ref 0.0–0.1)
Basophils Relative: 0.5 % (ref 0.0–3.0)
Eosinophils Absolute: 0.3 10*3/uL (ref 0.0–0.7)
Eosinophils Relative: 3.1 % (ref 0.0–5.0)
HCT: 39.3 % (ref 39.0–52.0)
Hemoglobin: 12.9 g/dL — ABNORMAL LOW (ref 13.0–17.0)
Lymphocytes Relative: 29.7 % (ref 12.0–46.0)
Lymphs Abs: 2.6 10*3/uL (ref 0.7–4.0)
MCHC: 32.8 g/dL (ref 30.0–36.0)
MCV: 82 fl (ref 78.0–100.0)
Monocytes Absolute: 0.6 10*3/uL (ref 0.1–1.0)
Monocytes Relative: 7.2 % (ref 3.0–12.0)
Neutro Abs: 5.2 10*3/uL (ref 1.4–7.7)
Neutrophils Relative %: 59.5 % (ref 43.0–77.0)
Platelets: 343 10*3/uL (ref 150.0–400.0)
RBC: 4.79 Mil/uL (ref 4.22–5.81)
RDW: 14.8 % (ref 11.5–15.5)
WBC: 8.7 10*3/uL (ref 4.0–10.5)

## 2022-12-09 LAB — BASIC METABOLIC PANEL
BUN: 16 mg/dL (ref 6–23)
CO2: 30 mEq/L (ref 19–32)
Calcium: 9.5 mg/dL (ref 8.4–10.5)
Chloride: 99 mEq/L (ref 96–112)
Creatinine, Ser: 0.78 mg/dL (ref 0.40–1.50)
GFR: 105.08 mL/min (ref >60.00)
Glucose, Bld: 111 mg/dL — ABNORMAL HIGH (ref 70–99)
Potassium: 3.4 mEq/L — ABNORMAL LOW (ref 3.5–5.1)
Sodium: 138 mEq/L (ref 135–145)

## 2022-12-09 LAB — FOLATE: Folate: >23.8 ng/mL (ref >5.9)

## 2022-12-09 LAB — MAGNESIUM: Magnesium: 1.9 mg/dL (ref 1.5–2.5)

## 2022-12-09 LAB — VITAMIN B12: Vitamin B-12: 699 pg/mL (ref 211–911)

## 2022-12-09 NOTE — Assessment & Plan Note (Signed)
Mild Anemia: Hemoglobin slightly low at 12.5. No signs of bleeding. -Check for iron, B12, and folate deficiencies. -Refer to gastroenterology for colon cancer screening.

## 2022-12-09 NOTE — Assessment & Plan Note (Signed)
Anasarca: Persistent fluid buildup with recent improvement on hydrochlorothiazide and Lasix. No signs of heart failure on BNP, although could be an issue with low due to obesity. No chest pain or shortness of breath. -Refer to cardiology for further evaluation. -Continue hydrochlorothiazide 25mg  and Lasix 40mg  as needed.

## 2022-12-09 NOTE — Assessment & Plan Note (Signed)
Obesity: BMI of 69 with multiple comorbidities including prediabetes, hyperlipidemia, and hypertension. -Refer to bariatric specialist for weight management consultation.

## 2022-12-09 NOTE — Assessment & Plan Note (Addendum)
Hypertension: Blood pressure controlled at 125/77 on Losartan 100mg  daily. -Continue Losartan 100mg  daily.

## 2022-12-09 NOTE — Assessment & Plan Note (Signed)
Hypokalemia: Chronic low potassium, possibly due to diuretic use. -Increase potassium supplement to twice daily. -Repeat BMP and magnesium

## 2022-12-09 NOTE — Progress Notes (Signed)
Assessment/Plan:   Problem List Items Addressed This Visit       Cardiovascular and Mediastinum   Hypertension    Hypertension: Blood pressure controlled at 125/77 on Losartan 100mg  daily. -Continue Losartan 100mg  daily.                Respiratory   OSA (obstructive sleep apnea) - Primary    Sleep Apnea: Patient uses CPAP at home. -Continue CPAP use. -Referral to sleep medicine for maintenance      Relevant Orders   Ambulatory referral to Sleep Studies     Other   Peripheral edema   Class 3 severe obesity with serious comorbidity in adult [facility])    Obesity: BMI of 69 with multiple comorbidities including prediabetes, hyperlipidemia, and hypertension. -Refer to bariatric specialist for weight management consultation.       Relevant Orders   Amb Referral to Bariatric Surgery   Hypokalemia    Hypokalemia: Chronic low potassium, possibly due to diuretic use. -Increase potassium supplement to twice daily. -Repeat BMP and magnesium      Relevant Orders   Basic Metabolic Panel (BMET)   Magnesium   Prediabetes    Prediabetes: A1c stable at 6.2 on Metformin 500mg  twice daily. -Continue Metformin 500mg  twice daily.      Hyperlipidemia    Hyperlipidemia: Total cholesterol elevated at 233 with low HDL at 38.6. 10-year risk of heart attack or stroke at 16.5%.  Statin intolerant due to myalgias.  Patient hesitant to start medication. -Discuss starting Repatha or Zetia for cholesterol management with cardiology/lipid clinic      Relevant Medications   Omega-3 Fatty Acids (FISH OIL PO)   Other Relevant Orders   AMB Referral to [facility]   Statin intolerance   Relevant Orders   AMB Referral to [facility]   Anasarca    Anasarca: Persistent fluid buildup with recent improvement on hydrochlorothiazide and Lasix. No signs of heart failure on BNP, although could be an issue with low due to obesity. No chest pain or  shortness of breath. -Refer to cardiology for further evaluation. -Continue hydrochlorothiazide 25mg  and Lasix 40mg  as needed.      Relevant Orders   Ambulatory referral to Cardiology   Anemia    Mild Anemia: Hemoglobin slightly low at 12.5. No signs of bleeding. -Check for iron, B12, and folate deficiencies. -Refer to gastroenterology for colon cancer screening.      Relevant Orders   CBC w/Diff   Vitamin B12   Folate   Iron and TIBC   Ferritin   Encounter for well adult exam without abnormal findings   Other Visit Diagnoses     Screening for colon cancer       Relevant Orders   Ambulatory referral to Gastroenterology   Screening for viral disease       Relevant Orders   HIV antibody (with reflex)       There are no discontinued medications.  Return in about 3 months (around 03/11/2023) for BP.    Subjective:   Encounter date: 12/09/2022  Tyler Carroll is a 47 y.o. male who has Peripheral edema; Hypertension; Chronic low back pain without sciatica; Chronic pain of right knee; Class 3 severe obesity with serious comorbidity in adult [facility]); Vitamin D deficiency; Hypokalemia; Prediabetes; Hyperlipidemia; Statin intolerance; Anasarca; OSA (obstructive sleep apnea); Anemia; and Encounter for well adult exam without abnormal findings on their problem list..   He  has a past medical history of  Diabetes mellitus without complication (HCC) and Hypertension.Marland Kitchen   He presents with chief complaint of Medical Management of Chronic Issues (B/p follow up and review labs. ) .  Discussed the use of AI scribe software for clinical note transcription with the patient, who gave verbal consent to proceed.  History of Present Illness   The patient, with a history of high blood pressure, prediabetes, high cholesterol, and obesity (BMI of 69), presented for a follow-up consultation regarding blood pressure management and lab results. The patient reported experiencing a decrease in body  swelling after starting a new blood pressure medication, but noted that the swelling has been gradually returning. The patient also mentioned needing paperwork for work accommodations due to the pain associated with the swelling.  The patient has a history of prediabetes and has been on metformin to prevent progression to diabetes. The patient's weight has fluctuated, and they have been trying to control it. The patient's A1c was 6.1 in 2020, and they denied ever having an A1c over 6.4. The patient was also on metformin, but there was not much change in their blood sugar levels, which remained in the prediabetic range.  The patient has a longstanding history of fluid buildup and has been on Lasix in the past. The patient reported swelling in their legs and throughout their body, which decreased when they started taking hydrochlorothiazide but has since returned. The patient denied any history of seeing a cardiologist or having any heart studies done. The patient's blood pressure was better at 125/77, and they were on hydrochlorothiazide 25, Lasix 40 mg daily as needed, and losartan 100 mg daily.  The patient also reported having sleep apnea and using a CPAP machine at home, but it has been a while since they last saw a sleep doctor. The patient expressed concern about the swelling and was interested in finding a way to reduce it. The patient also inquired about their ability to engage in sexual activity with their spouse and expressed reluctance about the possibility of needing bariatric surgery. The patient also asked about taking supplements and mentioned a specific one called Booster XT.      HPI:   The 10-year ASCVD risk score (Arnett DK, et al., 2019) is: 16.5%   Values used to calculate the score:     Age: 47 years     Sex: Male     Is Non-Hispanic African American: Yes     Diabetic: Yes     Tobacco smoker: No     Systolic Blood Pressure: 125 mmHg     Is BP treated: Yes     HDL Cholesterol:  38.6 mg/dL     Total Cholesterol: 233 mg/dL   Review of Systems  Constitutional:  Positive for malaise/fatigue. Negative for chills, diaphoresis, fever and weight loss.  HENT:  Negative for congestion, ear discharge, ear pain and hearing loss.   Eyes:  Negative for blurred vision, double vision, photophobia, pain, discharge and redness.  Respiratory:  Positive for shortness of breath. Negative for cough, sputum production and wheezing.   Cardiovascular:  Positive for leg swelling. Negative for chest pain and palpitations.  Gastrointestinal:  Negative for abdominal pain, blood in stool, constipation, diarrhea, heartburn, melena, nausea and vomiting.  Genitourinary:  Negative for dysuria, flank pain, frequency, hematuria and urgency.  Musculoskeletal:  Positive for back pain and joint pain (right knee). Negative for myalgias.  Skin:  Negative for itching and rash.  Neurological:  Negative for dizziness, tingling, tremors, speech change, seizures, loss  of consciousness, weakness and headaches.  Endo/Heme/Allergies:  Negative for polydipsia.  Psychiatric/Behavioral:  Negative for depression, hallucinations, memory loss, substance abuse and suicidal ideas. The patient does not have insomnia.   All other systems reviewed and are negative.   No past surgical history on file.  Outpatient Medications Prior to Visit  Medication Sig Dispense Refill   Acetaminophen (TYLENOL 8 HOUR PO) Take by mouth.     hydrochlorothiazide (HYDRODIURIL) 25 MG tablet TAKE 1 TABLET (25 MG TOTAL) BY MOUTH DAILY. 90 tablet 1   ibuprofen (ADVIL) 800 MG tablet Take 1 tablet (800 mg total) by mouth every 8 (eight) hours as needed. 21 tablet 0   losartan (COZAAR) 100 MG tablet Take 1 tablet (100 mg total) by mouth daily. 90 tablet 0   metFORMIN (GLUCOPHAGE) 500 MG tablet Take 1 tablet (500 mg total) by mouth 2 (two) times daily with a meal. 180 tablet 0   Omega-3 Fatty Acids (FISH OIL PO) Take by mouth.     potassium  chloride (KLOR-CON M) 20 MEQ tablet Take 1 tablet (20 mEq total) by mouth 2 (two) times daily. 60 tablet 2   Blood Pressure Monitoring (BLOOD PRESSURE KIT) KIT 1 each by Does not apply route daily. (Patient not taking: Reported on 12/09/2022) 1 kit 0   Evolocumab (REPATHA SURECLICK) 140 MG/ML SOAJ Inject 140 mg into the skin every 14 (fourteen) days. (Patient not taking: Reported on 12/09/2022) 6 mL 1   furosemide (LASIX) 40 MG tablet Take 1 tablet (40 mg total) by mouth daily. 90 tablet 0   Vitamin D, Ergocalciferol, (DRISDOL) 1.25 MG (50000 UNIT) CAPS capsule Take 50,000 Units by mouth once a week. (Patient not taking: Reported on 11/03/2022)     No facility-administered medications prior to visit.    Family History  Problem Relation Age of Onset   Hypertension Mother    Heart failure Father     Social History   Socioeconomic History   Marital status: Single    Spouse name: Not on file   Number of children: Not on file   Years of education: Not on file   Highest education level: Not on file  Occupational History   Not on file  Tobacco Use   Smoking status: Never    Passive exposure: Never   Smokeless tobacco: Never  Vaping Use   Vaping Use: Never used  Substance and Sexual Activity   Alcohol use: Not Currently   Drug use: Never   Sexual activity: Yes    Birth control/protection: None    Comment: Married  Other Topics Concern   Not on file  Social History Narrative   Not on file   Social Determinants of Health   Financial Resource Strain: Not on file  Food Insecurity: Not on file  Transportation Needs: Not on file  Physical Activity: Not on file  Stress: Not on file  Social Connections: Not on file  Intimate Partner Violence: Not on file  Objective:  Physical Exam: BP 125/77 (BP Location: Left Arm, Patient Position: Sitting, Cuff Size: Large)   Pulse 95   Temp 98.5 F  (36.9 C) (Oral)   Wt (!) 482 lb 3.2 oz (218.7 kg)   SpO2 99%   BMI 69.19 kg/m     Physical Exam Constitutional:      Appearance: Normal appearance.  HENT:     Head: Normocephalic and atraumatic.     Right Ear: Hearing normal.     Left Ear: Hearing normal.     Nose: Nose normal.  Eyes:     General: No scleral icterus.       Right eye: No discharge.        Left eye: No discharge.     Extraocular Movements: Extraocular movements intact.  Cardiovascular:     Rate and Rhythm: Normal rate and regular rhythm.     Heart sounds: Normal heart sounds.  Pulmonary:     Effort: Pulmonary effort is normal.     Breath sounds: Normal breath sounds.  Abdominal:     General: There is distension.     Palpations: Abdomen is soft.     Tenderness: There is no abdominal tenderness.  Musculoskeletal:     Right lower leg: 2+ Pitting Edema present.     Left lower leg: 2+ Pitting Edema present.  Skin:    General: Skin is warm.     Findings: No rash.  Neurological:     General: No focal deficit present.     Mental Status: He is alert.     Cranial Nerves: No cranial nerve deficit.  Psychiatric:        Mood and Affect: Mood normal.        Behavior: Behavior normal.        Thought Content: Thought content normal.        Judgment: Judgment normal.    Results   LABS A1c: 6.2 Microalbumin/Creatinine Ratio: normal Urinalysis: negative Vitamin D: within normal limits Hemoglobin: mild anemia Potassium: 3.1 BNP: low Liver Function Tests: normal Total Cholesterol: 233 HDL: 38.6      No results found.  Recent Results (from the past 2160 hour(s))  Brain natriuretic peptide     Status: None   Collection Time: 12/03/22  9:28 AM  Result Value Ref Range   Pro B Natriuretic peptide (BNP) 6.0 0.0 - 100.0 pg/mL  Comprehensive metabolic panel     Status: Abnormal   Collection Time: 12/03/22  9:28 AM  Result Value Ref Range   Sodium 139 135 - 145 mEq/L   Potassium 3.1 (L) 3.5 - 5.1 mEq/L    Chloride 99 96 - 112 mEq/L   CO2 31 19 - 32 mEq/L   Glucose, Bld 115 (H) 70 - 99 mg/dL   BUN 13 6 - 23 mg/dL   Creatinine, Ser 1.61 0.40 - 1.50 mg/dL   Total Bilirubin 0.4 0.2 - 1.2 mg/dL   Alkaline Phosphatase 78 39 - 117 U/L   AST 18 0 - 37 U/L   ALT 21 0 - 53 U/L   Total Protein 6.9 6.0 - 8.3 g/dL   Albumin 3.7 3.5 - 5.2 g/dL   GFR 096.04 >54.09 mL/min    Comment: Calculated using the CKD-EPI Creatinine Equation (2021)   Calcium 8.9 8.4 - 10.5 mg/dL  CBC with Differential/Platelet     Status: Abnormal   Collection Time: 12/03/22  9:28 AM  Result Value Ref Range   WBC 7.9 4.0 - 10.5 K/uL  RBC 4.70 4.22 - 5.81 Mil/uL   Hemoglobin 12.5 (L) 13.0 - 17.0 g/dL   HCT 69.6 (L) 29.5 - 28.4 %   MCV 81.7 78.0 - 100.0 fl   MCHC 32.7 30.0 - 36.0 g/dL   RDW 13.2 44.0 - 10.2 %   Platelets 346.0 150.0 - 400.0 K/uL   Neutrophils Relative % 60.2 43.0 - 77.0 %   Lymphocytes Relative 28.7 12.0 - 46.0 %   Monocytes Relative 7.4 3.0 - 12.0 %   Eosinophils Relative 3.3 0.0 - 5.0 %   Basophils Relative 0.4 0.0 - 3.0 %   Neutro Abs 4.8 1.4 - 7.7 K/uL   Lymphs Abs 2.3 0.7 - 4.0 K/uL   Monocytes Absolute 0.6 0.1 - 1.0 K/uL   Eosinophils Absolute 0.3 0.0 - 0.7 K/uL   Basophils Absolute 0.0 0.0 - 0.1 K/uL  Vitamin D 1,25 dihydroxy     Status: None   Collection Time: 12/03/22  9:28 AM  Result Value Ref Range   Vitamin D 1, 25 (OH)2 Total 32 18 - 72 pg/mL   Vitamin D3 1, 25 (OH)2 32 pg/mL   Vitamin D2 1, 25 (OH)2 <8 pg/mL    Comment: (Note) Vitamin D3, 1,25(OH)2 indicates both endogenous  production and supplementation. Vitamin D2, 1,25(OH)2 is  an indicator of exogenous sources, such as diet or  supplementation. Interpretation and therapy are based on  measurement of Vitamin D, 1,25 (OH)2, Total. . This test was developed, and its analytical performance  characteristics have been determined by [employer]. It has not been cleared or approved by the  FDA. This assay has been validated  pursuant to the CLIA  regulations and is used for clinical purposes. . For additional information, please refer to http://education.[facility].com/faq/FAQ199 (This link is being provided for  informational/educational purposes only.) . [facility] [address] [place] [address] [phone] Junita Push L. Tiarrah Saville Caul, MD, PhD   Urinalysis, Routine w reflex microscopic     Status: Abnormal   Collection Time: 12/03/22  9:28 AM  Result Value Ref Range   Color, Urine YELLOW Yellow;Lt. Yellow;Straw;Dark Yellow;Amber;Green;Red;Brown   APPearance CLEAR Clear;Turbid;Slightly Cloudy;Cloudy   Specific Gravity, Urine >=1.030 (A) 1.000 - 1.030   pH 6.0 5.0 - 8.0   Total Protein, Urine NEGATIVE Negative   Urine Glucose NEGATIVE Negative   Ketones, ur NEGATIVE Negative   Bilirubin Urine NEGATIVE Negative   Hgb urine dipstick NEGATIVE Negative   Urobilinogen, UA 0.2 0.0 - 1.0   Leukocytes,Ua NEGATIVE Negative   Nitrite NEGATIVE Negative   WBC, UA 0-2/hpf 0-2/hpf   RBC / HPF none seen 0-2/hpf   Mucus, UA Presence of (A) None  Microalbumin / creatinine urine ratio     Status: Abnormal   Collection Time: 12/03/22  9:28 AM  Result Value Ref Range   Microalb, Ur 2.4 (H) 0.0 - 1.9 mg/dL   Creatinine,U 956.3 mg/dL   Microalb Creat Ratio 1.1 0.0 - 30.0 mg/g  Hemoglobin A1c     Status: None   Collection Time: 12/03/22  9:28 AM  Result Value Ref Range   Hgb A1c MFr Bld 6.2 4.6 - 6.5 %    Comment: Glycemic Control Guidelines for People with Diabetes:Non Diabetic:  <6%Goal of Therapy: <7%Additional Action Suggested:  >8%   Lipid panel     Status: Abnormal   Collection Time: 12/03/22  9:28 AM  Result Value Ref Range   Cholesterol 233 (H) 0 - 200 mg/dL  Comment: ATP III Classification       Desirable:  < 200 mg/dL               Borderline High:  200 - 239 mg/dL          High:  > = 960 mg/dL   Triglycerides 454.0 0.0 - 149.0 mg/dL    Comment: Normal:  <981  mg/dLBorderline High:  150 - 199 mg/dL   HDL 19.14 (L) >78.29 mg/dL   VLDL 56.2 0.0 - 13.0 mg/dL   LDL Cholesterol 865 (H) 0 - 99 mg/dL   Total CHOL/HDL Ratio 6     Comment:                Men          Women1/2 Average Risk     3.4          3.3Average Risk          5.0          4.42X Average Risk          9.6          7.13X Average Risk          15.0          11.0                       NonHDL 194.14     Comment: NOTE:  Non-HDL goal should be 30 mg/dL higher than patient's LDL goal (i.e. LDL goal of < 70 mg/dL, would have non-HDL goal of < 100 mg/dL)  TSH     Status: None   Collection Time: 12/03/22  9:28 AM  Result Value Ref Range   TSH 1.34 0.35 - 5.50 uIU/mL        Garner Nash, MD, MS

## 2022-12-09 NOTE — Assessment & Plan Note (Signed)
Hyperlipidemia: Total cholesterol elevated at 233 with low HDL at 38.6. 10-year risk of heart attack or stroke at 16.5%.  Statin intolerant due to myalgias.  Patient hesitant to start medication. -Discuss starting Repatha or Zetia for cholesterol management with cardiology/lipid clinic

## 2022-12-09 NOTE — Patient Instructions (Signed)
VISIT SUMMARY:  During your recent visit, we discussed your ongoing issues with high blood pressure, prediabetes, high cholesterol, and obesity. You mentioned that the swelling in your body had decreased after starting a new blood pressure medication, but it has been gradually returning. We also discussed your concerns about your ability to engage in sexual activity, your reluctance about the possibility of needing bariatric surgery, and your interest in taking supplements.  YOUR PLAN:  -FLUID BUILDUP: You have persistent fluid buildup in your body. We will refer you to a heart specialist for further evaluation and you should continue taking your current medications.  -PREDIABETES: Your blood sugar levels are slightly elevated, which is a condition known as prediabetes. You should continue taking Metformin to manage this.  -HIGH CHOLESTEROL: Your cholesterol levels are high, which can increase your risk of heart disease. We will discuss starting new medications for cholesterol management with a heart specialist.  -LOW POTASSIUM: Your potassium levels are low, possibly due to the water pills you are taking. We will increase your potassium supplement dosage.  -MILD ANEMIA: Your hemoglobin levels are slightly low, which is a condition known as anemia. We will check for deficiencies in iron, B12, and folate, and refer you for a colon cancer screening.  -OBESITY: Your body mass index (BMI) is high, indicating obesity. We will refer you to a weight management specialist for consultation.  -HIGH BLOOD PRESSURE: Your blood pressure is well-controlled with your current medication. You should continue taking Losartan daily.  -SLEEP APNEA: You have sleep apnea, a condition where your breathing stops and starts while you sleep. You should continue using your CPAP machine at home.  INSTRUCTIONS:  We will need to check your magnesium level and repeat your blood work to evaluate your anemia. We may also  refer you to a lipid clinic for further discussion about managing your cholesterol levels.

## 2022-12-09 NOTE — Assessment & Plan Note (Signed)
Prediabetes: A1c stable at 6.2 on Metformin 500mg  twice daily. -Continue Metformin 500mg  twice daily.

## 2022-12-09 NOTE — Assessment & Plan Note (Signed)
Sleep Apnea: Patient uses CPAP at home. -Continue CPAP use. -Referral to sleep medicine for maintenance

## 2022-12-10 LAB — IRON AND TIBC
Iron Saturation: 18 % (ref 15–55)
Iron: 45 ug/dL (ref 38–169)
Total Iron Binding Capacity: 245 ug/dL — ABNORMAL LOW (ref 250–450)
UIBC: 200 ug/dL (ref 111–343)

## 2022-12-10 LAB — HIV ANTIBODY (ROUTINE TESTING W REFLEX): HIV 1&2 Ab, 4th Generation: NONREACTIVE

## 2022-12-16 ENCOUNTER — Encounter (HOSPITAL_COMMUNITY): Payer: Self-pay

## 2022-12-16 ENCOUNTER — Ambulatory Visit (HOSPITAL_COMMUNITY)
Admission: EM | Admit: 2022-12-16 | Discharge: 2022-12-16 | Disposition: A | Discharge status: Home / Self Care | Payer: BC Managed Care – PPO | Attending: Family Medicine | Admitting: Family Medicine

## 2022-12-16 DIAGNOSIS — K047 Periapical abscess without sinus: Secondary | ICD-10-CM

## 2022-12-16 HISTORY — DX: Low back pain, unspecified: M54.50

## 2022-12-16 MED ORDER — KETOROLAC TROMETHAMINE 30 MG/ML IJ SOLN
INTRAMUSCULAR | Status: AC
  Filled 2022-12-16: qty 1

## 2022-12-16 MED ORDER — AMOXICILLIN-POT CLAVULANATE 875-125 MG PO TABS: 1.0000 | ORAL_TABLET | Freq: Two times a day (BID) | ORAL | 0 refills | Status: AC

## 2022-12-16 MED ORDER — KETOROLAC TROMETHAMINE 10 MG PO TABS: 10.0000 mg | ORAL_TABLET | Freq: Four times a day (QID) | ORAL | 0 refills | Status: DC | PRN

## 2022-12-16 MED ORDER — KETOROLAC TROMETHAMINE 30 MG/ML IJ SOLN
30.0000 mg | Freq: Once | INTRAMUSCULAR | Status: AC
  Administered 2022-12-16: 30 mg via INTRAMUSCULAR

## 2022-12-16 NOTE — ED Provider Notes (Signed)
[facility]    CSN: [id number] Arrival date & time: 12/16/22  1354      History   Chief Complaint Chief Complaint  Patient presents with   Dental Pain    HPI Tyler Carroll is a 47 y.o. male.    Dental Pain Here for left-sided facial pain and jaw pain.  It is radiating up into his left ear.  Is been going on for about 6 days and has gotten worse.  Has a broken tooth.  Chewing makes it feel worse.  He has had some subjective fever.  He is also had some chills and sweats he is not allergic to any antibiotics.   He is on treatment for hypertension.  His blood pressure today is 175/103.  He does have follow-up with his primary care  Past Medical History:  Diagnosis Date   Diabetes mellitus without complication (HCC)    "pre-diabetic" 01/01/20   Hypertension    Lumbago     Patient Active Problem List   Diagnosis Date Noted   Anasarca 12/09/2022   OSA (obstructive sleep apnea) 12/09/2022   Anemia 12/09/2022   Encounter for well adult exam without abnormal findings 12/09/2022   Statin intolerance 12/05/2022   Chronic low back pain without sciatica 11/03/2022   Chronic pain of right knee 11/03/2022   Class 3 severe obesity with serious comorbidity in adult [facility]) 11/03/2022   Vitamin D deficiency 11/03/2022   Hypokalemia 11/03/2022   Prediabetes 11/03/2022   Hyperlipidemia 11/03/2022   Peripheral edema 05/02/2019   Hypertension 05/02/2019    History reviewed. No pertinent surgical history.     Home Medications    Prior to Admission medications   Medication Sig Start Date End Date Taking? Authorizing Provider  Acetaminophen (TYLENOL 8 HOUR PO) Take by mouth.   Yes [provider]  amoxicillin-clavulanate (AUGMENTIN) 875-125 MG tablet Take 1 tablet by mouth 2 (two) times daily for 7 days. 12/16/22 12/23/22 Yes Truly Stankiewicz, Janace Aris, MD  furosemide (LASIX) 40 MG tablet Take 1 tablet (40 mg total) by mouth daily. 09/08/22 12/16/22 Yes White, Adrienne R,  NP  hydrochlorothiazide (HYDRODIURIL) 25 MG tablet TAKE 1 TABLET (25 MG TOTAL) BY MOUTH DAILY. 11/26/22 01/25/23 Yes Garnette Gunner, MD  ketorolac (TORADOL) 10 MG tablet Take 1 tablet (10 mg total) by mouth every 6 (six) hours as needed (pain). 12/16/22  Yes Zenia Resides, MD  losartan (COZAAR) 100 MG tablet Take 1 tablet (100 mg total) by mouth daily. 09/08/22  Yes White, Elita Boone, NP  metFORMIN (GLUCOPHAGE) 500 MG tablet Take 1 tablet (500 mg total) by mouth 2 (two) times daily with a meal. 09/08/22  Yes White, Adrienne R, NP  MULTIPLE VITAMIN PO Take by mouth.   Yes [provider]  Omega-3 Fatty Acids (FISH OIL PO) Take by mouth.   Yes [provider]  potassium chloride (KLOR-CON M) 20 MEQ tablet Take 1 tablet (20 mEq total) by mouth 2 (two) times daily. 12/05/22 03/05/23 Yes Garnette Gunner, MD  Blood Pressure Monitoring (BLOOD PRESSURE KIT) KIT 1 each by Does not apply route daily. Patient not taking: Reported on 12/09/2022 11/03/22   Garnette Gunner, MD  Evolocumab (REPATHA SURECLICK) 140 MG/ML SOAJ Inject 140 mg into the skin every 14 (fourteen) days. Patient not taking: Reported on 12/09/2022 12/05/22 03/05/23  Garnette Gunner, MD  losartan-hydrochlorothiazide (HYZAAR) 100-25 MG tablet Take 1 tablet by mouth daily. 10/24/19 01/01/20  Eustace Moore, MD  potassium chloride (KLOR-CON)  10 MEQ tablet Take by mouth. 12/27/18 09/03/19  [provider]  rosuvastatin (CRESTOR) 10 MG tablet Take 1 tablet (10 mg total) by mouth daily. 10/24/19 02/17/20  Eustace Moore, MD    Family History Family History  Problem Relation Age of Onset   Hypertension Mother    Heart failure Father     Social History Social History   Tobacco Use   Smoking status: Never    Passive exposure: Never   Smokeless tobacco: Never  Vaping Use   Vaping status: Never Used  Substance Use Topics   Alcohol use: Not Currently   Drug use: Never     Allergies   Ace inhibitors, Amlodipine,  and Statins   Review of Systems Review of Systems   Physical Exam Triage Vital Signs ED Triage Vitals  Encounter Vitals Group     BP 12/16/22 1444 (!) 175/103     Systolic BP Percentile --      Diastolic BP Percentile --      Pulse Rate 12/16/22 1444 89     Resp 12/16/22 1444 16     Temp 12/16/22 1444 98.8 F (37.1 C)     Temp Source 12/16/22 1444 Oral     SpO2 12/16/22 1444 95 %     Weight 12/16/22 1444 (!) 465 lb (210.9 kg)     Height 12/16/22 1444 6' (1.829 m)     Head Circumference --      Peak Flow --      Pain Score 12/16/22 1443 6     Pain Loc --      Pain Education --      Exclude from Growth Chart --    No data found.  Updated Vital Signs BP (!) 175/103 (BP Location: Left Arm)   Pulse 89   Temp 98.8 F (37.1 C) (Oral)   Resp 16   Ht 6' (1.829 m)   Wt (!) 210.9 kg   SpO2 95%   BMI 63.07 kg/m   Visual Acuity Right Eye Distance:   Left Eye Distance:   Bilateral Distance:    Right Eye Near:   Left Eye Near:    Bilateral Near:     Physical Exam Vitals reviewed.  Constitutional:      General: He is not in acute distress.    Appearance: He is not ill-appearing, toxic-appearing or diaphoretic.  HENT:     Left Ear: Tympanic membrane and ear canal normal.     Nose: Nose normal.     Mouth/Throat:     Mouth: Mucous membranes are moist.     Comments: There is a broken tooth in the posterior molar.  There is some mild swelling of the buccal mucosa. Eyes:     Extraocular Movements: Extraocular movements intact.     Conjunctiva/sclera: Conjunctivae normal.     Pupils: Pupils are equal, round, and reactive to light.  Cardiovascular:     Rate and Rhythm: Normal rate and regular rhythm.     Heart sounds: No murmur heard. Pulmonary:     Effort: Pulmonary effort is normal.     Breath sounds: Normal breath sounds.  Musculoskeletal:     Cervical back: Neck supple.  Lymphadenopathy:     Cervical: No cervical adenopathy.  Skin:    Coloration: Skin is not  jaundiced or pale.  Neurological:     General: No focal deficit present.     Mental Status: He is alert and oriented to person, place, and time.  Psychiatric:        Behavior: Behavior normal.      UC Treatments / Results  Labs (all labs ordered are listed, but only abnormal results are displayed) Labs Reviewed - No data to display  EKG   Radiology No results found.  Procedures Procedures (including critical care time)  Medications Ordered in UC Medications  ketorolac (TORADOL) 30 MG/ML injection 30 mg (has no administration in time range)    Initial Impression / Assessment and Plan / UC Course  I have reviewed the triage vital signs and the nursing notes.  Pertinent labs & imaging results that were available during my care of the patient were reviewed by me and considered in my medical decision making (see chart for details).        Augmentin is sent in for infection.  Toradol injection is given here and Toradol tablets are sent to the pharmacy.  He is given a list of low-cost dental providers, and we discussed his following up with a dentist. Final Clinical Impressions(s) / UC Diagnoses   Final diagnoses:  Dental infection     Discharge Instructions      Take amoxicillin-clavulanate 875 mg--1 tab twice daily with food for 7 days  You have been given a shot of Toradol 30 mg today.  Ketorolac 10 mg tablets--take 1 tablet every 6 hours as needed for pain.  This is the same medicine that is in the shot we just gave you  Please follow-up with a dentist.     ED Prescriptions     Medication Sig Dispense Auth. Provider   amoxicillin-clavulanate (AUGMENTIN) 875-125 MG tablet Take 1 tablet by mouth 2 (two) times daily for 7 days. 14 tablet Samaad Hashem, Janace Aris, MD   ketorolac (TORADOL) 10 MG tablet Take 1 tablet (10 mg total) by mouth every 6 (six) hours as needed (pain). 20 tablet Alyah Boehning, Janace Aris, MD      I have reviewed the PDMP during this encounter.    Zenia Resides, MD 12/16/22 [phone]

## 2022-12-16 NOTE — Discharge Instructions (Signed)
Take amoxicillin-clavulanate 875 mg--1 tab twice daily with food for 7 days  You have been given a shot of Toradol 30 mg today.  Ketorolac 10 mg tablets--take 1 tablet every 6 hours as needed for pain.  This is the same medicine that is in the shot we just gave you  Please follow-up with a dentist.

## 2022-12-16 NOTE — ED Triage Notes (Signed)
Patient here today with c/o left side ear pain and face pain X 5-6 days. He is having some left side facial swelling. One of his lower left side tooth is cracked. He has been taking IBU and Tylenol with some relief. He has noticed chills and sweats occasionally

## 2022-12-19 ENCOUNTER — Other Ambulatory Visit: Payer: Self-pay

## 2022-12-19 ENCOUNTER — Telehealth: Payer: Self-pay | Admitting: Family Medicine

## 2022-12-19 DIAGNOSIS — I10 Essential (primary) hypertension: Secondary | ICD-10-CM

## 2022-12-19 MED ORDER — LOSARTAN POTASSIUM 100 MG PO TABS
100.0000 mg | ORAL_TABLET | Freq: Every day | ORAL | 0 refills | Status: DC
Start: 2022-12-19 — End: 2023-03-18

## 2022-12-19 NOTE — Telephone Encounter (Signed)
Pt needs his losartan (COZAAR) 100 MG tablet [[id number]] and his furosemide (LASIX) 40 MG tablet [[id number]]  ENDED refilled please send to [facility] on [address]

## 2022-12-23 MED ORDER — FUROSEMIDE 40 MG PO TABS: 40.0000 mg | ORAL_TABLET | Freq: Every day | ORAL | 0 refills | Status: DC

## 2022-12-23 NOTE — Addendum Note (Signed)
Addended by: Loyola Mast on: 12/23/2022 05:26 PM   Modules accepted: Orders

## 2023-01-02 ENCOUNTER — Ambulatory Visit: Payer: Managed Care, Other (non HMO) | Admitting: Family Medicine

## 2023-01-05 ENCOUNTER — Encounter: Payer: Self-pay | Admitting: Family Medicine

## 2023-01-05 ENCOUNTER — Ambulatory Visit (INDEPENDENT_AMBULATORY_CARE_PROVIDER_SITE_OTHER): Payer: Managed Care, Other (non HMO) | Admitting: Family Medicine

## 2023-01-05 VITALS — BP 148/84 | HR 82 | Temp 97.9°F | Wt >= 6400 oz

## 2023-01-05 DIAGNOSIS — R3589 Other polyuria: Secondary | ICD-10-CM

## 2023-01-05 DIAGNOSIS — I10 Essential (primary) hypertension: Secondary | ICD-10-CM | POA: Diagnosis not present

## 2023-01-05 DIAGNOSIS — R350 Frequency of micturition: Secondary | ICD-10-CM | POA: Diagnosis not present

## 2023-01-05 DIAGNOSIS — Z6841 Body Mass Index (BMI) 40.0 and over, adult: Secondary | ICD-10-CM

## 2023-01-05 DIAGNOSIS — M545 Low back pain, unspecified: Secondary | ICD-10-CM | POA: Diagnosis not present

## 2023-01-05 DIAGNOSIS — G8929 Other chronic pain: Secondary | ICD-10-CM

## 2023-01-05 DIAGNOSIS — K029 Dental caries, unspecified: Secondary | ICD-10-CM

## 2023-01-05 HISTORY — DX: Dental caries, unspecified: K02.9

## 2023-01-05 HISTORY — DX: Other polyuria: R35.89

## 2023-01-05 MED ORDER — AMOXICILLIN-POT CLAVULANATE 875-125 MG PO TABS: 1.0000 | ORAL_TABLET | Freq: Two times a day (BID) | ORAL | 0 refills | Status: DC

## 2023-01-05 NOTE — Patient Instructions (Signed)
Complete the prescribed course of Augmentin: Take 1 pill twice a day for 7 days to address dental infection. Follow up with a dentist: Schedule a dental appointment to address tooth decay and recurrent infections promptly. Monitor blood pressure: Regularly check your blood pressure at home and report any significant changes to the provider. Adhere to recommended work accommodations: Ensure you take the necessary breaks and follow the 8 a.m. to 5 p.m. work schedule to manage your condition effectively. Maintain hydration and monitor urination frequency: Track bathroom usage and report any significant changes or concerns

## 2023-01-05 NOTE — Progress Notes (Signed)
Assessment/Plan:   Problem List Items Addressed This Visit       Cardiovascular and Mediastinum   Hypertension     Digestive   Dental caries    Ongoing dental pain with evidence of tooth decay and dental caries.  Plan: Prescribe Augmentin 875 mg/125 mg, 1 pill twice a day for 7 days. Recommend follow-up with a dentist to address dental caries and evaluate for potential long-term solutions like implants. Monitor blood pressure and reassess if infection persists after antibiotic course.      Relevant Medications   amoxicillin-clavulanate (AUGMENTIN) 875-125 MG tablet     Other   Chronic low back pain without sciatica    Chronic pain aggravated by prolonged sitting and obesity (BMI 65).  Plan: Recommend work accommodations to include 90-minute break times per workday to address frequent urination due to diuretics and lower back pain. Provide intermittent leave of absence, up to 2 days per month, for medical appointments. Specify work hours from 8 AM to 5 PM to allow for timely medication administration. Follow-up in one month for reassessment.      Relevant Medications   IBUPROFEN PO   Class 3 severe obesity with serious comorbidity in adult [facility])   Frequency of urination and polyuria - Primary    There are no discontinued medications.  Return if symptoms worsen or fail to improve.    Subjective:   Encounter date: 01/05/2023  Tyler Carroll is a 47 y.o. male who has Peripheral edema; Hypertension; Chronic low back pain without sciatica; Chronic pain of right knee; Class 3 severe obesity with serious comorbidity in adult [facility]); Vitamin D deficiency; Hypokalemia; Prediabetes; Hyperlipidemia; Statin intolerance; Anasarca; OSA (obstructive sleep apnea); Anemia; Encounter for well adult exam without abnormal findings; Dental caries; and Frequency of urination and polyuria on their problem list..   He  has a past medical history of Diabetes mellitus without complication  (HCC), Hypertension, and Lumbago.Marland Kitchen   He presents with chief complaint of Medical Management of Chronic Issues (Job needs a work note with accomadations included in work note with our Research officer, political party. Cracked tooth on upper right side. ) .   Chief Complaint: Work accommodations for lower back pain and dental pain.  History of Present Illness:  1. Lower Back Pain. The patient presents with persistent lower back pain, which necessitates work accommodations. The pain is described as a limiting factor in the patient's ability to sit for extended periods, which is the primary requirement of their job. The patient is currently taking diuretics, including hydrochlorothiazide and Lasix, which cause frequent urination up to 5 times a day, further necessitating additional break times.  2. Dental Pain. The patient reports ongoing dental pain primarily on the left side of the jaw. Previously treated at urgent care about a month ago for a dental infection on the left side, which now appears to have spread to the right side, accompanied by evidence of tooth decay and dental caries. The patient complains the pain sometimes radiates to the ear area.  Review of Systems  HENT:         Tooth pain   Genitourinary:  Positive for frequency and urgency.  Musculoskeletal:  Positive for back pain.    No past surgical history on file.  Outpatient Medications Prior to Visit  Medication Sig Dispense Refill   Acetaminophen (TYLENOL 8 HOUR PO) Take by mouth.     furosemide (LASIX) 40 MG tablet Take 1 tablet (40 mg total) by mouth daily. 90 tablet 0  hydrochlorothiazide (HYDRODIURIL) 25 MG tablet TAKE 1 TABLET (25 MG TOTAL) BY MOUTH DAILY. 90 tablet 1   IBUPROFEN PO Take by mouth.     ketorolac (TORADOL) 10 MG tablet Take 1 tablet (10 mg total) by mouth every 6 (six) hours as needed (pain). 20 tablet 0   losartan (COZAAR) 100 MG tablet Take 1 tablet (100 mg total) by mouth daily. 90 tablet 0   metFORMIN (GLUCOPHAGE) 500  MG tablet Take 1 tablet (500 mg total) by mouth 2 (two) times daily with a meal. 180 tablet 0   MULTIPLE VITAMIN PO Take by mouth.     Omega-3 Fatty Acids (FISH OIL PO) Take by mouth.     potassium chloride (KLOR-CON M) 20 MEQ tablet Take 1 tablet (20 mEq total) by mouth 2 (two) times daily. 60 tablet 2   Blood Pressure Monitoring (BLOOD PRESSURE KIT) KIT 1 each by Does not apply route daily. (Patient not taking: Reported on 12/09/2022) 1 kit 0   Evolocumab (REPATHA SURECLICK) 140 MG/ML SOAJ Inject 140 mg into the skin every 14 (fourteen) days. (Patient not taking: Reported on 12/09/2022) 6 mL 1   No facility-administered medications prior to visit.    Family History  Problem Relation Age of Onset   Hypertension Mother    Heart failure Father     Social History   Socioeconomic History   Marital status: Single    Spouse name: Not on file   Number of children: Not on file   Years of education: Not on file   Highest education level: Not on file  Occupational History   Not on file  Tobacco Use   Smoking status: Never    Passive exposure: Never   Smokeless tobacco: Never  Vaping Use   Vaping status: Never Used  Substance and Sexual Activity   Alcohol use: Not Currently   Drug use: Never   Sexual activity: Yes    Birth control/protection: None    Comment: Married  Other Topics Concern   Not on file  Social History Narrative   Not on file   Social Determinants of Health   Financial Resource Strain: Not on file  Food Insecurity: Not on file  Transportation Needs: Not on file  Physical Activity: Not on file  Stress: Not on file  Social Connections: Not on file  Intimate Partner Violence: Not on file                                                                                                  Objective:  Physical Exam: BP (!) 148/84 (BP Location: Left Arm, Patient Position: Sitting, Cuff Size: Large)   Pulse 82   Temp 97.9 F (36.6 C) (Temporal)   Wt (!) 481 lb 3.2  oz (218.3 kg)   SpO2 94%   BMI 65.26 kg/m   Wt Readings from Last 3 Encounters:  01/05/23 (!) 481 lb 3.2 oz (218.3 kg)  12/16/22 (!) 465 lb (210.9 kg)  12/09/22 (!) 482 lb 3.2 oz (218.7 kg)     Physical Exam Constitutional:      Appearance:  Normal appearance.  HENT:     Head: Normocephalic and atraumatic.     Right Ear: Hearing normal.     Left Ear: Hearing normal.     Nose: Nose normal.     Mouth/Throat:     Dentition: Dental caries present.  Eyes:     General: No scleral icterus.       Right eye: No discharge.        Left eye: No discharge.     Extraocular Movements: Extraocular movements intact.  Cardiovascular:     Comments: No cyanosis, no JVD Pulmonary:     Effort: Pulmonary effort is normal.     Comments: No auditory wheezing Musculoskeletal:     Comments: Normal Ambulation. No clubbing  Skin:    General: Skin is warm.     Findings: No rash.  Neurological:     General: No focal deficit present.     Mental Status: He is alert.     Cranial Nerves: No cranial nerve deficit.  Psychiatric:        Mood and Affect: Mood normal.        Behavior: Behavior normal.        Thought Content: Thought content normal.        Judgment: Judgment normal.     No results found.  Recent Results (from the past 2160 hour(s))  Brain natriuretic peptide     Status: None   Collection Time: 12/03/22  9:28 AM  Result Value Ref Range   Pro B Natriuretic peptide (BNP) 6.0 0.0 - 100.0 pg/mL  Comprehensive metabolic panel     Status: Abnormal   Collection Time: 12/03/22  9:28 AM  Result Value Ref Range   Sodium 139 135 - 145 mEq/L   Potassium 3.1 (L) 3.5 - 5.1 mEq/L   Chloride 99 96 - 112 mEq/L   CO2 31 19 - 32 mEq/L   Glucose, Bld 115 (H) 70 - 99 mg/dL   BUN 13 6 - 23 mg/dL   Creatinine, Ser 7.82 0.40 - 1.50 mg/dL   Total Bilirubin 0.4 0.2 - 1.2 mg/dL   Alkaline Phosphatase 78 39 - 117 U/L   AST 18 0 - 37 U/L   ALT 21 0 - 53 U/L   Total Protein 6.9 6.0 - 8.3 g/dL   Albumin  3.7 3.5 - 5.2 g/dL   GFR 956.21 >30.86 mL/min    Comment: Calculated using the CKD-EPI Creatinine Equation (2021)   Calcium 8.9 8.4 - 10.5 mg/dL  CBC with Differential/Platelet     Status: Abnormal   Collection Time: 12/03/22  9:28 AM  Result Value Ref Range   WBC 7.9 4.0 - 10.5 K/uL   RBC 4.70 4.22 - 5.81 Mil/uL   Hemoglobin 12.5 (L) 13.0 - 17.0 g/dL   HCT 57.8 (L) 46.9 - 62.9 %   MCV 81.7 78.0 - 100.0 fl   MCHC 32.7 30.0 - 36.0 g/dL   RDW 52.8 41.3 - 24.4 %   Platelets 346.0 150.0 - 400.0 K/uL   Neutrophils Relative % 60.2 43.0 - 77.0 %   Lymphocytes Relative 28.7 12.0 - 46.0 %   Monocytes Relative 7.4 3.0 - 12.0 %   Eosinophils Relative 3.3 0.0 - 5.0 %   Basophils Relative 0.4 0.0 - 3.0 %   Neutro Abs 4.8 1.4 - 7.7 K/uL   Lymphs Abs 2.3 0.7 - 4.0 K/uL   Monocytes Absolute 0.6 0.1 - 1.0 K/uL   Eosinophils Absolute 0.3 0.0 - 0.7 K/uL  Basophils Absolute 0.0 0.0 - 0.1 K/uL  Vitamin D 1,25 dihydroxy     Status: None   Collection Time: 12/03/22  9:28 AM  Result Value Ref Range   Vitamin D 1, 25 (OH)2 Total 32 18 - 72 pg/mL   Vitamin D3 1, 25 (OH)2 32 pg/mL   Vitamin D2 1, 25 (OH)2 <8 pg/mL    Comment: (Note) Vitamin D3, 1,25(OH)2 indicates both endogenous  production and supplementation. Vitamin D2, 1,25(OH)2 is  an indicator of exogenous sources, such as diet or  supplementation. Interpretation and therapy are based on  measurement of Vitamin D, 1,25 (OH)2, Total. . This test was developed, and its analytical performance  characteristics have been determined by [employer]. It has not been cleared or approved by the  FDA. This assay has been validated pursuant to the CLIA  regulations and is used for clinical purposes. . For additional information, please refer to http://education.[facility].com/faq/FAQ199 (This link is being provided for  informational/educational purposes only.) . [facility] [address] [place]  [id number] [phone] Junita Push L. Layken Doenges Caul, MD, PhD   Urinalysis, Routine w reflex microscopic     Status: Abnormal   Collection Time: 12/03/22  9:28 AM  Result Value Ref Range   Color, Urine YELLOW Yellow;Lt. Yellow;Straw;Dark Yellow;Amber;Green;Red;Brown   APPearance CLEAR Clear;Turbid;Slightly Cloudy;Cloudy   Specific Gravity, Urine >=1.030 (A) 1.000 - 1.030   pH 6.0 5.0 - 8.0   Total Protein, Urine NEGATIVE Negative   Urine Glucose NEGATIVE Negative   Ketones, ur NEGATIVE Negative   Bilirubin Urine NEGATIVE Negative   Hgb urine dipstick NEGATIVE Negative   Urobilinogen, UA 0.2 0.0 - 1.0   Leukocytes,Ua NEGATIVE Negative   Nitrite NEGATIVE Negative   WBC, UA 0-2/hpf 0-2/hpf   RBC / HPF none seen 0-2/hpf   Mucus, UA Presence of (A) None  Microalbumin / creatinine urine ratio     Status: Abnormal   Collection Time: 12/03/22  9:28 AM  Result Value Ref Range   Microalb, Ur 2.4 (H) 0.0 - 1.9 mg/dL   Creatinine,U 846.9 mg/dL   Microalb Creat Ratio 1.1 0.0 - 30.0 mg/g  Hemoglobin A1c     Status: None   Collection Time: 12/03/22  9:28 AM  Result Value Ref Range   Hgb A1c MFr Bld 6.2 4.6 - 6.5 %    Comment: Glycemic Control Guidelines for People with Diabetes:Non Diabetic:  <6%Goal of Therapy: <7%Additional Action Suggested:  >8%   Lipid panel     Status: Abnormal   Collection Time: 12/03/22  9:28 AM  Result Value Ref Range   Cholesterol 233 (H) 0 - 200 mg/dL    Comment: ATP III Classification       Desirable:  < 200 mg/dL               Borderline High:  200 - 239 mg/dL          High:  > = 629 mg/dL   Triglycerides 528.4 0.0 - 149.0 mg/dL    Comment: Normal:  <132 mg/dLBorderline High:  150 - 199 mg/dL   HDL 44.01 (L) >02.72 mg/dL   VLDL 53.6 0.0 - 64.4 mg/dL   LDL Cholesterol 034 (H) 0 - 99 mg/dL   Total CHOL/HDL Ratio 6     Comment:                Men          Women1/2 Average Risk  3.4          3.3Average Risk          5.0          4.42X Average Risk          9.6           7.13X Average Risk          15.0          11.0                       NonHDL 194.14     Comment: NOTE:  Non-HDL goal should be 30 mg/dL higher than patient's LDL goal (i.e. LDL goal of < 70 mg/dL, would have non-HDL goal of < 100 mg/dL)  TSH     Status: None   Collection Time: 12/03/22  9:28 AM  Result Value Ref Range   TSH 1.34 0.35 - 5.50 uIU/mL  Basic Metabolic Panel (BMET)     Status: Abnormal   Collection Time: 12/09/22 10:29 AM  Result Value Ref Range   Sodium 138 135 - 145 mEq/L   Potassium 3.4 (L) 3.5 - 5.1 mEq/L   Chloride 99 96 - 112 mEq/L   CO2 30 19 - 32 mEq/L   Glucose, Bld 111 (H) 70 - 99 mg/dL   BUN 16 6 - 23 mg/dL   Creatinine, Ser 4.40 0.40 - 1.50 mg/dL   GFR 102.72 >53.66 mL/min    Comment: Calculated using the CKD-EPI Creatinine Equation (2021)   Calcium 9.5 8.4 - 10.5 mg/dL  Magnesium     Status: None   Collection Time: 12/09/22 10:29 AM  Result Value Ref Range   Magnesium 1.9 1.5 - 2.5 mg/dL  CBC w/Diff     Status: Abnormal   Collection Time: 12/09/22 10:29 AM  Result Value Ref Range   WBC 8.7 4.0 - 10.5 K/uL   RBC 4.79 4.22 - 5.81 Mil/uL   Hemoglobin 12.9 (L) 13.0 - 17.0 g/dL   HCT 44.0 34.7 - 42.5 %   MCV 82.0 78.0 - 100.0 fl   MCHC 32.8 30.0 - 36.0 g/dL   RDW 95.6 38.7 - 56.4 %   Platelets 343.0 150.0 - 400.0 K/uL   Neutrophils Relative % 59.5 43.0 - 77.0 %   Lymphocytes Relative 29.7 12.0 - 46.0 %   Monocytes Relative 7.2 3.0 - 12.0 %   Eosinophils Relative 3.1 0.0 - 5.0 %   Basophils Relative 0.5 0.0 - 3.0 %   Neutro Abs 5.2 1.4 - 7.7 K/uL   Lymphs Abs 2.6 0.7 - 4.0 K/uL   Monocytes Absolute 0.6 0.1 - 1.0 K/uL   Eosinophils Absolute 0.3 0.0 - 0.7 K/uL   Basophils Absolute 0.0 0.0 - 0.1 K/uL  Vitamin B12     Status: None   Collection Time: 12/09/22 10:29 AM  Result Value Ref Range   Vitamin B-12 699 211 - 911 pg/mL  Folate     Status: None   Collection Time: 12/09/22 10:29 AM  Result Value Ref Range   Folate >23.8 >5.9 ng/mL  Iron and TIBC      Status: Abnormal   Collection Time: 12/09/22 10:29 AM  Result Value Ref Range   Total Iron Binding Capacity 245 (L) 250 - 450 ug/dL   UIBC 332 951 - 884 ug/dL   Iron 45 38 - 166 ug/dL   Iron Saturation 18 15 - 55 %  Ferritin     Status: None  Collection Time: 12/09/22 10:29 AM  Result Value Ref Range   Ferritin 125.4 22.0 - 322.0 ng/mL  HIV antibody (with reflex)     Status: None   Collection Time: 12/09/22 10:29 AM  Result Value Ref Range   HIV 1&2 Ab, 4th Generation NON-REACTIVE NON-REACTIVE    Comment: HIV-1 antigen and HIV-1/HIV-2 antibodies were not detected. There is no laboratory evidence of HIV infection. Marland Kitchen PLEASE NOTE: This information has been disclosed to you from records whose confidentiality may be protected by state law.  If your state requires such protection, then the state law prohibits you from making any further disclosure of the information without the specific written consent of the person to whom it pertains, or as otherwise permitted by law. A general authorization for the release of medical or other information is NOT sufficient for this purpose. . For additional information please refer to http://education.[facility].com/faq/FAQ106 (This link is being provided for informational/ educational purposes only.) . Marland Kitchen The performance of this assay has not been clinically validated in patients less than 18 years old. Garner Nash, MD, MS

## 2023-01-05 NOTE — Assessment & Plan Note (Signed)
Ongoing dental pain with evidence of tooth decay and dental caries.  Plan: Prescribe Augmentin 875 mg/125 mg, 1 pill twice a day for 7 days. Recommend follow-up with a dentist to address dental caries and evaluate for potential long-term solutions like implants. Monitor blood pressure and reassess if infection persists after antibiotic course.

## 2023-01-05 NOTE — Assessment & Plan Note (Signed)
Chronic pain aggravated by prolonged sitting and obesity (BMI 65).  Plan: Recommend work accommodations to include 90-minute break times per workday to address frequent urination due to diuretics and lower back pain. Provide intermittent leave of absence, up to 2 days per month, for medical appointments. Specify work hours from 8 AM to 5 PM to allow for timely medication administration. Follow-up in one month for reassessment.

## 2023-01-06 ENCOUNTER — Telehealth: Payer: Self-pay

## 2023-01-06 NOTE — Telephone Encounter (Signed)
Work accomodation note created, sign and faxed to [phone] as requested. Pt notified via mychart.

## 2023-02-13 ENCOUNTER — Ambulatory Visit (HOSPITAL_COMMUNITY)
Admission: EM | Admit: 2023-02-13 | Discharge: 2023-02-13 | Disposition: A | Discharge status: Home / Self Care | Payer: BC Managed Care – PPO | Attending: Emergency Medicine | Admitting: Emergency Medicine

## 2023-02-13 ENCOUNTER — Encounter (HOSPITAL_COMMUNITY): Payer: Self-pay

## 2023-02-13 DIAGNOSIS — J0110 Acute frontal sinusitis, unspecified: Secondary | ICD-10-CM | POA: Diagnosis not present

## 2023-02-13 DIAGNOSIS — J069 Acute upper respiratory infection, unspecified: Secondary | ICD-10-CM

## 2023-02-13 MED ORDER — IBUPROFEN 800 MG PO TABS: 800.0000 mg | ORAL_TABLET | Freq: Three times a day (TID) | ORAL | 0 refills | Status: DC | PRN

## 2023-02-13 MED ORDER — AMOXICILLIN-POT CLAVULANATE 875-125 MG PO TABS: 1.0000 | ORAL_TABLET | Freq: Two times a day (BID) | ORAL | 0 refills | Status: AC

## 2023-02-13 NOTE — ED Provider Notes (Signed)
[facility]    CSN: [id number] Arrival date & time: 02/13/23  1200      History   Chief Complaint Chief Complaint  Patient presents with   Nasal Congestion    HPI Tyler Carroll is a 47 y.o. male.   Patient presents with congestion, headache, ear pain, runny nose, sneezing, chills, and fatigue x 4 days.  Patient states he has been taking ibuprofen, Tylenol, and Benadryl with some relief. Reports that his kids were sick last week.     Past Medical History:  Diagnosis Date   Diabetes mellitus without complication (HCC)    "pre-diabetic" 01/01/20   Hypertension    Lumbago     Patient Active Problem List   Diagnosis Date Noted   Dental caries 01/05/2023   Frequency of urination and polyuria 01/05/2023   Anasarca 12/09/2022   OSA (obstructive sleep apnea) 12/09/2022   Anemia 12/09/2022   Encounter for well adult exam without abnormal findings 12/09/2022   Statin intolerance 12/05/2022   Chronic low back pain without sciatica 11/03/2022   Chronic pain of right knee 11/03/2022   Class 3 severe obesity with serious comorbidity in adult [facility]) 11/03/2022   Vitamin D deficiency 11/03/2022   Hypokalemia 11/03/2022   Prediabetes 11/03/2022   Hyperlipidemia 11/03/2022   Peripheral edema 05/02/2019   Hypertension 05/02/2019    History reviewed. No pertinent surgical history.     Home Medications    Prior to Admission medications   Medication Sig Start Date End Date Taking? Authorizing Provider  amoxicillin-clavulanate (AUGMENTIN) 875-125 MG tablet Take 1 tablet by mouth every 12 (twelve) hours for 7 days. 02/13/23 02/20/23 Yes Susann Givens, Valton Schwartz A, NP  ibuprofen (ADVIL) 800 MG tablet Take 1 tablet (800 mg total) by mouth 3 (three) times daily as needed for headache, mild pain or moderate pain. 02/13/23  Yes Susann Givens, Shaely Gadberry A, NP  Acetaminophen (TYLENOL 8 HOUR PO) Take by mouth.    [provider]  Blood Pressure Monitoring (BLOOD PRESSURE KIT) KIT 1  each by Does not apply route daily. Patient not taking: Reported on 12/09/2022 11/03/22   Garnette Gunner, MD  Evolocumab (REPATHA SURECLICK) 140 MG/ML SOAJ Inject 140 mg into the skin every 14 (fourteen) days. Patient not taking: Reported on 12/09/2022 12/05/22 03/05/23  Garnette Gunner, MD  furosemide (LASIX) 40 MG tablet Take 1 tablet (40 mg total) by mouth daily. 12/23/22 03/23/23  Loyola Mast, MD  hydrochlorothiazide (HYDRODIURIL) 25 MG tablet TAKE 1 TABLET (25 MG TOTAL) BY MOUTH DAILY. 11/26/22 01/25/23  Garnette Gunner, MD  IBUPROFEN PO Take by mouth.    [provider]  ketorolac (TORADOL) 10 MG tablet Take 1 tablet (10 mg total) by mouth every 6 (six) hours as needed (pain). 12/16/22   Zenia Resides, MD  losartan (COZAAR) 100 MG tablet Take 1 tablet (100 mg total) by mouth daily. 12/19/22   Garnette Gunner, MD  metFORMIN (GLUCOPHAGE) 500 MG tablet Take 1 tablet (500 mg total) by mouth 2 (two) times daily with a meal. 09/08/22   White, Elita Boone, NP  MULTIPLE VITAMIN PO Take by mouth.    [provider]  Omega-3 Fatty Acids (FISH OIL PO) Take by mouth.    [provider]  potassium chloride (KLOR-CON M) 20 MEQ tablet Take 1 tablet (20 mEq total) by mouth 2 (two) times daily. 12/05/22 03/05/23  Garnette Gunner, MD  losartan-hydrochlorothiazide (HYZAAR) 100-25 MG tablet Take 1 tablet by mouth daily. 10/24/19 01/01/20  Eustace Moore, MD  potassium chloride (KLOR-CON) 10 MEQ tablet Take by mouth. 12/27/18 09/03/19  [provider]  rosuvastatin (CRESTOR) 10 MG tablet Take 1 tablet (10 mg total) by mouth daily. 10/24/19 02/17/20  Eustace Moore, MD    Family History Family History  Problem Relation Age of Onset   Hypertension Mother    Heart failure Father     Social History Social History   Tobacco Use   Smoking status: Never    Passive exposure: Never   Smokeless tobacco: Never  Vaping Use   Vaping status: Never Used  Substance Use Topics    Alcohol use: Not Currently   Drug use: Never     Allergies   Ace inhibitors, Amlodipine, and Statins   Review of Systems Review of Systems  Constitutional:  Positive for activity change, chills and fatigue. Negative for fever.  HENT:  Positive for congestion, ear pain, rhinorrhea, sinus pressure, sinus pain and sneezing. Negative for sore throat and trouble swallowing.   Respiratory:  Negative for cough, chest tightness and shortness of breath.   Cardiovascular:  Negative for chest pain.  Gastrointestinal:  Negative for abdominal pain, diarrhea, nausea and vomiting.  Skin:  Negative for color change.  Neurological:  Positive for headaches. Negative for dizziness, syncope, weakness and light-headedness.     Physical Exam Triage Vital Signs ED Triage Vitals  Encounter Vitals Group     BP 02/13/23 1305 (!) 176/88     Systolic BP Percentile --      Diastolic BP Percentile --      Pulse Rate 02/13/23 1305 91     Resp 02/13/23 1305 16     Temp 02/13/23 1305 98.8 F (37.1 C)     Temp Source 02/13/23 1305 Oral     SpO2 02/13/23 1305 94 %     Weight 02/13/23 1305 (!) 463 lb (210 kg)     Height 02/13/23 1305 6' (1.829 m)     Head Circumference --      Peak Flow --      Pain Score 02/13/23 1304 5     Pain Loc --      Pain Education --      Exclude from Growth Chart --    No data found.  Updated Vital Signs BP (!) 176/88 (BP Location: Left Arm)   Pulse 91   Temp 98.8 F (37.1 C) (Oral)   Resp 16   Ht 6' (1.829 m)   Wt (!) 463 lb (210 kg)   SpO2 94%   BMI 62.79 kg/m   Visual Acuity Right Eye Distance:   Left Eye Distance:   Bilateral Distance:    Right Eye Near:   Left Eye Near:    Bilateral Near:     Physical Exam Vitals and nursing note reviewed.  Constitutional:      General: He is awake. He is not in acute distress.    Appearance: Normal appearance. He is well-developed and well-groomed. He is obese. He is not ill-appearing, toxic-appearing or  diaphoretic.  HENT:     Right Ear: Tympanic membrane, ear canal and external ear normal.     Left Ear: Tympanic membrane, ear canal and external ear normal.     Nose: Congestion and rhinorrhea present.     Right Sinus: Maxillary sinus tenderness and frontal sinus tenderness present.     Left Sinus: Maxillary sinus tenderness and frontal sinus tenderness present.     Mouth/Throat:  Mouth: Mucous membranes are moist.     Pharynx: Posterior oropharyngeal erythema and postnasal drip present. No pharyngeal swelling or oropharyngeal exudate.     Tonsils: No tonsillar exudate.  Cardiovascular:     Rate and Rhythm: Normal rate.     Heart sounds: Normal heart sounds.  Pulmonary:     Effort: Pulmonary effort is normal. No respiratory distress.     Breath sounds: Normal breath sounds. No decreased breath sounds, wheezing, rhonchi or rales.  Musculoskeletal:     Cervical back: Normal range of motion.  Skin:    General: Skin is warm and dry.  Neurological:     Mental Status: He is alert.  Psychiatric:        Behavior: Behavior is cooperative.      UC Treatments / Results  Labs (all labs ordered are listed, but only abnormal results are displayed) Labs Reviewed - No data to display  EKG   Radiology No results found.  Procedures Procedures (including critical care time)  Medications Ordered in UC Medications - No data to display  Initial Impression / Assessment and Plan / UC Course  I have reviewed the triage vital signs and the nursing notes.  Pertinent labs & imaging results that were available during my care of the patient were reviewed by me and considered in my medical decision making (see chart for details).     Patient presented with 4-day history of congestion, headache, ear pain, runny nose, sneezing, chills, and fatigue. Patient states he has been taking ibuprofen, Tylenol, and Benadryl with some relief. Reports that his kids and wife were sick last week. Denies  cough, shortness of breath, chest tightness, fever, abdominal pain, nausea, vomiting, and diarrhea. Upon assessment patient is tender to frontal and maxillary sinuses.  Patient's blood pressure is elevated in clinic today the patient states he has not taken his blood pressure medication today.  Denies dizziness, weakness, blurry vision, and confusion. Prescribed Augmentin for sinusitis. Prescribed 800 mg ibuprofen per patient request for headache. Discussed over-the-counter medications he can take for symptoms.  Discussed return precautions. Final Clinical Impressions(s) / UC Diagnoses   Final diagnoses:  Acute non-recurrent frontal sinusitis  Acute upper respiratory infection     Discharge Instructions      Please start taking Augmentin as prescribed for sinusitis until completely finished. You can continue taking Ibuprofen and Tylenol as needed for headache and fever. You can also take over-the-counter Zyrtec daily to help with congestion and Flonase nasal spray. There is a over-the-counter medication called Coricidin for congestion and cough that you can take due to high blood pressure. Please take your blood pressure medication when you get home. Otherwise get some rest and stay hydrated. You can return here if symptoms persist.     ED Prescriptions     Medication Sig Dispense Auth. Provider   amoxicillin-clavulanate (AUGMENTIN) 875-125 MG tablet Take 1 tablet by mouth every 12 (twelve) hours for 7 days. 14 tablet Susann Givens, Saud Bail A, NP   ibuprofen (ADVIL) 800 MG tablet Take 1 tablet (800 mg total) by mouth 3 (three) times daily as needed for headache, mild pain or moderate pain. 21 tablet Wynonia Lawman A, NP      PDMP not reviewed this encounter.   Wynonia Lawman A, NP 02/13/23 1331

## 2023-02-13 NOTE — ED Triage Notes (Signed)
Patient here today with c/o headache, ear, pain, runny nose, sneeze, and fatigue X 4 days. He thinks he had a fever yesterday. He has been taking IBU, Tylenol, and Benadryl with some relief. His kids were sick last week. No recent travel.

## 2023-02-13 NOTE — Discharge Instructions (Signed)
Please start taking Augmentin as prescribed for sinusitis until completely finished. You can continue taking Ibuprofen and Tylenol as needed for headache and fever. You can also take over-the-counter Zyrtec daily to help with congestion and Flonase nasal spray. There is a over-the-counter medication called Coricidin for congestion and cough that you can take due to high blood pressure. Please take your blood pressure medication when you get home. Otherwise get some rest and stay hydrated. You can return here if symptoms persist.

## 2023-03-12 ENCOUNTER — Ambulatory Visit: Payer: BC Managed Care – PPO | Admitting: Family Medicine

## 2023-03-18 ENCOUNTER — Other Ambulatory Visit: Payer: Self-pay | Admitting: Family Medicine

## 2023-03-18 DIAGNOSIS — I10 Essential (primary) hypertension: Secondary | ICD-10-CM

## 2023-03-26 ENCOUNTER — Ambulatory Visit: Payer: BC Managed Care – PPO | Admitting: Family Medicine

## 2023-04-09 ENCOUNTER — Ambulatory Visit: Payer: BC Managed Care – PPO | Admitting: Family Medicine

## 2023-04-13 ENCOUNTER — Other Ambulatory Visit: Payer: Self-pay | Admitting: Family Medicine

## 2023-05-14 ENCOUNTER — Ambulatory Visit: Payer: BC Managed Care – PPO | Admitting: Family Medicine

## 2023-05-22 ENCOUNTER — Ambulatory Visit: Payer: BC Managed Care – PPO | Admitting: Family Medicine

## 2023-05-22 ENCOUNTER — Other Ambulatory Visit: Payer: Self-pay | Admitting: Family Medicine

## 2023-05-22 DIAGNOSIS — E876 Hypokalemia: Secondary | ICD-10-CM

## 2023-05-31 ENCOUNTER — Ambulatory Visit (HOSPITAL_COMMUNITY)
Admission: EM | Admit: 2023-05-31 | Discharge: 2023-05-31 | Disposition: A | Discharge status: Home / Self Care | Payer: BC Managed Care – PPO | Attending: Family Medicine | Admitting: Family Medicine

## 2023-05-31 ENCOUNTER — Other Ambulatory Visit: Payer: Self-pay

## 2023-05-31 ENCOUNTER — Encounter (HOSPITAL_COMMUNITY): Payer: Self-pay | Admitting: Emergency Medicine

## 2023-05-31 DIAGNOSIS — J329 Chronic sinusitis, unspecified: Secondary | ICD-10-CM | POA: Diagnosis not present

## 2023-05-31 DIAGNOSIS — B9689 Other specified bacterial agents as the cause of diseases classified elsewhere: Secondary | ICD-10-CM | POA: Diagnosis not present

## 2023-05-31 DIAGNOSIS — I10 Essential (primary) hypertension: Secondary | ICD-10-CM

## 2023-05-31 MED ORDER — IBUPROFEN 600 MG PO TABS: 600.0000 mg | ORAL_TABLET | Freq: Three times a day (TID) | ORAL | 0 refills | Status: DC | PRN

## 2023-05-31 MED ORDER — FLUTICASONE PROPIONATE 50 MCG/ACT NA SUSP: 2.0000 | Freq: Every day | NASAL | 0 refills | Status: DC

## 2023-05-31 MED ORDER — AMOXICILLIN-POT CLAVULANATE 875-125 MG PO TABS: 1.0000 | ORAL_TABLET | Freq: Two times a day (BID) | ORAL | 0 refills | Status: AC

## 2023-05-31 NOTE — ED Provider Notes (Signed)
[facility]    CSN: [id number] Arrival date & time: 05/31/23  1137      History   Chief Complaint Chief Complaint  Patient presents with   URI    HPI Tyler Carroll is a 47 y.o. male.   The history is provided by the patient. No language interpreter was used.  URI Presenting symptoms: congestion and cough   Presenting symptoms comment:  Sinus congestion and runny nose. Right facial swelling with toothache. His facial started feeling swollen and tender yesterday. He has been coughing with sinus congestion for 4-5 days Severity:  Moderate Onset quality:  Gradual Timing:  Constant Progression:  Worsening Chronicity:  New Relieved by: Ibuprofen and Tylenol for pain. Ineffective treatments: Zyrtec, Benadryl and OTC Flonase spray. Associated symptoms: headaches and sinus pain   Associated symptoms: no wheezing   Associated symptoms comment:  He felt hot for the past few days. No ear pain. Risk factors: sick contacts   Risk factors comment:  Son was sick last week. Hx of sinus infection that presented the same way in the past. She was treated with Amox  HTN -  2 days ago  Past Medical History:  Diagnosis Date   Diabetes mellitus without complication (HCC)    "pre-diabetic" 01/01/20   Hypertension    Lumbago     Patient Active Problem List   Diagnosis Date Noted   Dental caries 01/05/2023   Frequency of urination and polyuria 01/05/2023   Anasarca 12/09/2022   OSA (obstructive sleep apnea) 12/09/2022   Anemia 12/09/2022   Encounter for well adult exam without abnormal findings 12/09/2022   Statin intolerance 12/05/2022   Chronic low back pain without sciatica 11/03/2022   Chronic pain of right knee 11/03/2022   Class 3 severe obesity with serious comorbidity in adult [facility]) 11/03/2022   Vitamin D deficiency 11/03/2022   Hypokalemia 11/03/2022   Prediabetes 11/03/2022   Hyperlipidemia 11/03/2022   Peripheral edema 05/02/2019   Hypertension 05/02/2019     History reviewed. No pertinent surgical history.     Home Medications    Prior to Admission medications   Medication Sig Start Date End Date Taking? Authorizing Provider  amoxicillin-clavulanate (AUGMENTIN) 875-125 MG tablet Take 1 tablet by mouth every 12 (twelve) hours for 7 days. 05/31/23 06/07/23 Yes Doreene Eland, MD  fluticasone (FLONASE) 50 MCG/ACT nasal spray Place 2 sprays into both nostrils daily. 05/31/23  Yes Doreene Eland, MD  ibuprofen (ADVIL) 600 MG tablet Take 1 tablet (600 mg total) by mouth every 8 (eight) hours as needed for moderate pain (pain score 4-6). 05/31/23  Yes Doreene Eland, MD  Acetaminophen (TYLENOL 8 HOUR PO) Take by mouth.    [provider]  Blood Pressure Monitoring (BLOOD PRESSURE KIT) KIT 1 each by Does not apply route daily. Patient not taking: Reported on 12/09/2022 11/03/22   Garnette Gunner, MD  furosemide (LASIX) 40 MG tablet TAKE 1 TABLET BY MOUTH EVERY DAY 04/13/23   Garnette Gunner, MD  hydrochlorothiazide (HYDRODIURIL) 25 MG tablet TAKE 1 TABLET (25 MG TOTAL) BY MOUTH DAILY. 11/26/22 01/25/23  Garnette Gunner, MD  ketorolac (TORADOL) 10 MG tablet Take 1 tablet (10 mg total) by mouth every 6 (six) hours as needed (pain). Patient not taking: Reported on 05/31/2023 12/16/22   Zenia Resides, MD  losartan (COZAAR) 100 MG tablet TAKE 1 TABLET BY MOUTH EVERY DAY 03/18/23   Garnette Gunner, MD  metFORMIN (GLUCOPHAGE) 500 MG tablet Take 1  tablet (500 mg total) by mouth 2 (two) times daily with a meal. 09/08/22   White, Elita Boone, NP  MULTIPLE VITAMIN PO Take by mouth.    [provider]  Omega-3 Fatty Acids (FISH OIL PO) Take by mouth.    [provider]  potassium chloride SA (KLOR-CON M20) 20 MEQ tablet TAKE 1 TABLET BY MOUTH TWICE A DAY 05/22/23   Garnette Gunner, MD  losartan-hydrochlorothiazide (HYZAAR) 100-25 MG tablet Take 1 tablet by mouth daily. 10/24/19 01/01/20  Eustace Moore, MD  potassium  chloride (KLOR-CON) 10 MEQ tablet Take by mouth. 12/27/18 09/03/19  [provider]  rosuvastatin (CRESTOR) 10 MG tablet Take 1 tablet (10 mg total) by mouth daily. 10/24/19 02/17/20  Eustace Moore, MD    Family History Family History  Problem Relation Age of Onset   Hypertension Mother    Heart failure Father     Social History Social History   Tobacco Use   Smoking status: Never    Passive exposure: Never   Smokeless tobacco: Never  Vaping Use   Vaping status: Never Used  Substance Use Topics   Alcohol use: Not Currently   Drug use: Never     Allergies   Ace inhibitors, Amlodipine, and Statins   Review of Systems Review of Systems  HENT:  Positive for congestion and sinus pain.   Respiratory:  Positive for cough. Negative for wheezing.   Neurological:  Positive for headaches.     Physical Exam Triage Vital Signs ED Triage Vitals  Encounter Vitals Group     BP 05/31/23 1227 (!) 186/97     Systolic BP Percentile --      Diastolic BP Percentile --      Pulse Rate 05/31/23 1227 89     Resp 05/31/23 1227 20     Temp 05/31/23 1227 98.2 F (36.8 C)     Temp Source 05/31/23 1227 Oral     SpO2 05/31/23 1227 95 %     Weight --      Height --      Head Circumference --      Peak Flow --      Pain Score 05/31/23 1225 7     Pain Loc --      Pain Education --      Exclude from Growth Chart --    No data found.  Updated Vital Signs BP (!) 167/96 (BP Location: Left Arm) Comment (BP Location): large cuff on left forearm  Pulse 89   Temp 98.2 F (36.8 C) (Oral)   Resp 20   SpO2 95%   Visual Acuity Right Eye Distance:   Left Eye Distance:   Bilateral Distance:    Right Eye Near:   Left Eye Near:    Bilateral Near:     Physical Exam Vitals and nursing note reviewed.  HENT:     Head:     Comments: Mild right maxilla swelling with tenderness Gums looks healthy. He does have dental caries    Right Ear: Tympanic membrane and ear canal normal.      Left Ear: Tympanic membrane and ear canal normal.     Mouth/Throat:     Pharynx: No oropharyngeal exudate or posterior oropharyngeal erythema.  Eyes:     Conjunctiva/sclera: Conjunctivae normal.  Cardiovascular:     Rate and Rhythm: Normal rate and regular rhythm.     Heart sounds: Normal heart sounds. No murmur heard. Pulmonary:     Effort:  Pulmonary effort is normal. No respiratory distress.     Breath sounds: Normal breath sounds. No wheezing.  Musculoskeletal:     Cervical back: Neck supple.      UC Treatments / Results  Labs (all labs ordered are listed, but only abnormal results are displayed) Labs Reviewed - No data to display  EKG   Radiology No results found.  Procedures Procedures (including critical care time)  Medications Ordered in UC Medications - No data to display  Initial Impression / Assessment and Plan / UC Course  I have reviewed the triage vital signs and the nursing notes.  Pertinent labs & imaging results that were available during my care of the patient were reviewed by me and considered in my medical decision making (see chart for details).  Clinical Course as of 05/31/23 1303  Sun May 31, 2023  1301 Acute bacterial sinusitis Augmentin escribed Ibuprofen 600 mg prn pain escribed as well as Flonase PCP f/u for monitoring recommended [KE]  1302 HTN BP elevated due to medication non-adherence He will resume his medications today F/U with PCP for medication management  [KE]  1303 Dental caries I discussed f/u with dentist for dental care He agreed with the plan [KE]    Clinical Course User Index [KE] Doreene Eland, MD     Final Clinical Impressions(s) / UC Diagnoses   Final diagnoses:  Bacterial sinusitis  Essential hypertension, benign     Discharge Instructions      It was nice seeing you today. I am sorry about your sinus pain. I sent in Augmentin and pain medication as well as Flonase to your pharmacy. Please follow  up with your PCP for reassessment. Also please take you BP meds as soon as you return home.       ED Prescriptions     Medication Sig Dispense Auth. Provider   amoxicillin-clavulanate (AUGMENTIN) 875-125 MG tablet Take 1 tablet by mouth every 12 (twelve) hours for 7 days. 14 tablet Janit Pagan T, MD   ibuprofen (ADVIL) 600 MG tablet Take 1 tablet (600 mg total) by mouth every 8 (eight) hours as needed for moderate pain (pain score 4-6). 60 tablet Zadkiel Dragan T, MD   fluticasone (FLONASE) 50 MCG/ACT nasal spray Place 2 sprays into both nostrils daily. 9.9 mL Doreene Eland, MD      PDMP not reviewed this encounter.   Doreene Eland, MD 05/31/23 [phone]

## 2023-05-31 NOTE — Discharge Instructions (Signed)
It was nice seeing you today. I am sorry about your sinus pain. I sent in Augmentin and pain medication as well as Flonase to your pharmacy. Please follow up with your PCP for reassessment. Also please take you BP meds as soon as you return home.

## 2023-05-31 NOTE — ED Triage Notes (Signed)
Right facial swelling started yesterday.  Pain increased throughout the night.   Reports "sinus issues" started 5 days ago.  Taking OTC medicines.

## 2023-06-02 ENCOUNTER — Ambulatory Visit: Payer: BC Managed Care – PPO | Admitting: Family Medicine

## 2023-06-19 ENCOUNTER — Encounter: Payer: Self-pay | Admitting: Family Medicine

## 2023-06-19 ENCOUNTER — Ambulatory Visit (INDEPENDENT_AMBULATORY_CARE_PROVIDER_SITE_OTHER): Payer: BC Managed Care – PPO | Admitting: Family Medicine

## 2023-06-19 VITALS — BP 118/70 | HR 83 | Temp 97.6°F | Ht 72.0 in | Wt >= 6400 oz

## 2023-06-19 DIAGNOSIS — Z6841 Body Mass Index (BMI) 40.0 and over, adult: Secondary | ICD-10-CM

## 2023-06-19 DIAGNOSIS — G4733 Obstructive sleep apnea (adult) (pediatric): Secondary | ICD-10-CM | POA: Diagnosis not present

## 2023-06-19 DIAGNOSIS — R600 Localized edema: Secondary | ICD-10-CM

## 2023-06-19 DIAGNOSIS — R7303 Prediabetes: Secondary | ICD-10-CM

## 2023-06-19 DIAGNOSIS — E66813 Obesity, class 3: Secondary | ICD-10-CM

## 2023-06-19 DIAGNOSIS — I10 Essential (primary) hypertension: Secondary | ICD-10-CM

## 2023-06-19 LAB — POCT GLYCOSYLATED HEMOGLOBIN (HGB A1C): Hemoglobin A1C: 6.1 % — AB (ref 4.0–5.6)

## 2023-06-19 MED ORDER — BLOOD GLUCOSE TEST VI STRP
1.0000 | ORAL_STRIP | Freq: Three times a day (TID) | 0 refills | Status: AC
Start: 2023-06-19 — End: 2023-07-19

## 2023-06-19 MED ORDER — LOSARTAN POTASSIUM-HCTZ 100-25 MG PO TABS: 1.0000 | ORAL_TABLET | Freq: Every day | ORAL | 3 refills | Status: DC

## 2023-06-19 MED ORDER — LANCET DEVICE MISC
1.0000 | Freq: Three times a day (TID) | 0 refills | Status: AC
Start: 2023-06-19 — End: 2023-07-19

## 2023-06-19 MED ORDER — LANCETS MISC. MISC
1.0000 | Freq: Three times a day (TID) | 0 refills | Status: AC
Start: 2023-06-19 — End: 2023-07-19

## 2023-06-19 MED ORDER — BLOOD GLUCOSE MONITORING SUPPL DEVI: 1.0000 | Freq: Three times a day (TID) | 0 refills | Status: DC

## 2023-06-19 NOTE — Assessment & Plan Note (Signed)
Chronic bilateral leg swelling, managed with compression stockings and Lasix and HCTZ.   Plan:  Continue Lasix and HCTZ Reinforce use of compression stockings and leg elevation.

## 2023-06-19 NOTE — Progress Notes (Signed)
Assessment/Plan:   Problem List Items Addressed This Visit       Cardiovascular and Mediastinum   Hypertension - Primary   Long-standing, stable currently managed with combinations of hydrochlorothiazide/losartan and Lasix  Plan:  Continue current medications          Relevant Medications   losartan-hydrochlorothiazide (HYZAAR) 100-25 MG tablet   Blood Glucose Monitoring Suppl DEVI   Glucose Blood (BLOOD GLUCOSE TEST STRIPS) STRP   Lancet Device MISC   Lancets Misc. MISC     Respiratory   OSA (obstructive sleep apnea)   Referral to sleep medicine for follow-up on CPAP usage      Relevant Orders   Ambulatory referral to Sleep Studies     Other   Peripheral edema   Chronic bilateral leg swelling, managed with compression stockings and Lasix and HCTZ.   Plan:  Continue Lasix and HCTZ Reinforce use of compression stockings and leg elevation.      Class 3 severe obesity with serious comorbidity in adult [facility])   Worsening.    Plan:  Referral to dietitian and weight management clinic Discussed weight loss medications (Zepbound, Wegovy, Saxenda, Contrave); patient to check insurance coverage Encourage continued exercise (aim for 150 minutes per week) Start dietary modifications with guidance from dietitian      Relevant Orders   Amb ref to Medical Nutrition Therapy-MNT   Amb Ref to Medical Weight Management   Prediabetes   HbA1c at 6.1%, decision to discontinue metformin to observe impact of lifestyle modifications  Plan: Discontinue metformin Monitor fasting blood glucose at home a couple of times per week Follow-up in 1 month      Relevant Medications   losartan-hydrochlorothiazide (HYZAAR) 100-25 MG tablet   Other Relevant Orders   POC HgB A1c (Completed)    Medications Discontinued During This Encounter  Medication Reason   ketorolac (TORADOL) 10 MG tablet    metFORMIN (GLUCOPHAGE) 500 MG tablet    hydrochlorothiazide (HYDRODIURIL) 25 MG  tablet    losartan (COZAAR) 100 MG tablet     Return in about 1 month (around 07/20/2023) for BP, blood sugar, weight.    Subjective:   Encounter date: 06/19/2023  Tyler Carroll is a 48 y.o. male who has Peripheral edema; Hypertension; Chronic low back pain without sciatica; Chronic pain of right knee; Class 3 severe obesity with serious comorbidity in adult [facility]); Vitamin D deficiency; Hypokalemia; Prediabetes; Hyperlipidemia; Statin intolerance; Anasarca; OSA (obstructive sleep apnea); Anemia; Encounter for well adult exam without abnormal findings; Dental caries; and Frequency of urination and polyuria on their problem list..   He  has a past medical history of Diabetes mellitus without complication (HCC), Hypertension, and Lumbago..   Chief Complaint: Follow-up on blood pressure and lab results; discuss weight concerns and overall health.  History of Present Illness:  Hypertension. The patient has been on multiple medications to manage his blood pressure. His blood pressure is currently well controlled at 118/70 mmHg. He continues to take hydrochlorothiazide and losartan, and Lasix (40 mg daily) for lower leg swelling.   Weight Management. [name] reports a significant weight gain since his last visit in September, increasing from 463 pounds to 483 pounds. He expresses a strong desire to lose weight without undergoing bariatric surgery. He has begun light exercise, including calisthenics like sit-ups and push-ups, about three times a week for 30-minute sessions. He seeks guidance on diet and weight loss strategies. Referral to a dietitian and a weight management clinic is planned. Discussion about weight loss  medications (Zepbound, [place], Saxenda, [place]) took place, and he will check with his insurance for coverage.  Prediabetes. His recent HbA1c is 6.1%, which is at goal. He has been taking metformin (500 mg twice daily).   Swelling. The patient continues to experience swelling,  which is managed with Lasix and hydrochlorothiazide. No new complaints regarding swelling.  Sleep Apnea. [name] has a history of obstructive sleep apnea managed with CPAP. He mentions that his CPAP machine is old.    No past surgical history on file.  Outpatient Medications Prior to Visit  Medication Sig Dispense Refill   Acetaminophen (TYLENOL 8 HOUR PO) Take by mouth.     fluticasone (FLONASE) 50 MCG/ACT nasal spray Place 2 sprays into Carroll nostrils daily. 9.9 mL 0   furosemide (LASIX) 40 MG tablet TAKE 1 TABLET BY MOUTH EVERY DAY 90 tablet 0   ibuprofen (ADVIL) 600 MG tablet Take 1 tablet (600 mg total) by mouth every 8 (eight) hours as needed for moderate pain (pain score 4-6). 60 tablet 0   MULTIPLE VITAMIN PO Take by mouth.     Omega-3 Fatty Acids (FISH OIL PO) Take by mouth.     potassium chloride SA (KLOR-CON M20) 20 MEQ tablet TAKE 1 TABLET BY MOUTH TWICE A DAY 180 tablet 0   losartan (COZAAR) 100 MG tablet TAKE 1 TABLET BY MOUTH EVERY DAY 90 tablet 0   metFORMIN (GLUCOPHAGE) 500 MG tablet Take 1 tablet (500 mg total) by mouth 2 (two) times daily with a meal. 180 tablet 0   Blood Pressure Monitoring (BLOOD PRESSURE KIT) KIT 1 each by Does not apply route daily. (Patient not taking: Reported on 12/09/2022) 1 kit 0   hydrochlorothiazide (HYDRODIURIL) 25 MG tablet TAKE 1 TABLET (25 MG TOTAL) BY MOUTH DAILY. 90 tablet 1   ketorolac (TORADOL) 10 MG tablet Take 1 tablet (10 mg total) by mouth every 6 (six) hours as needed (pain). (Patient not taking: Reported on 06/19/2023) 20 tablet 0   No facility-administered medications prior to visit.    Family History  Problem Relation Age of Onset   Hypertension Mother    Heart failure Father     Social History   Socioeconomic History   Marital status: Single    Spouse name: Not on file   Number of children: Not on file   Years of education: Not on file   Highest education level: Not on file  Occupational History   Not on file  Tobacco  Use   Smoking status: Never    Passive exposure: Never   Smokeless tobacco: Never  Vaping Use   Vaping status: Never Used  Substance and Sexual Activity   Alcohol use: Not Currently   Drug use: Never   Sexual activity: Yes    Birth control/protection: None    Comment: Married  Other Topics Concern   Not on file  Social History Narrative   Not on file   Social Drivers of Health   Financial Resource Strain: Not on file  Food Insecurity: Not on file  Transportation Needs: Not on file  Physical Activity: Not on file  Stress: Not on file  Social Connections: Not on file  Intimate Partner Violence: Not on file  Objective:  Physical Exam: BP 118/70   Pulse 83   Temp 97.6 F (36.4 C) (Temporal)   Ht 6' (1.829 m)   Wt (!) 483 lb 6.4 oz (219.3 kg)   SpO2 98%   BMI 65.56 kg/m   Wt Readings from Last 3 Encounters:  06/19/23 (!) 483 lb 6.4 oz (219.3 kg)  02/13/23 (!) 463 lb (210 kg)  01/05/23 (!) 481 lb 3.2 oz (218.3 kg)     Physical Exam Constitutional:      Appearance: Normal appearance.  HENT:     Head: Normocephalic and atraumatic.     Right Ear: Hearing normal.     Left Ear: Hearing normal.     Nose: Nose normal.  Eyes:     General: No scleral icterus.       Right eye: No discharge.        Left eye: No discharge.     Extraocular Movements: Extraocular movements intact.  Cardiovascular:     Rate and Rhythm: Normal rate and regular rhythm.     Heart sounds: Normal heart sounds.  Pulmonary:     Effort: Pulmonary effort is normal.     Breath sounds: Normal breath sounds.  Abdominal:     Palpations: Abdomen is soft.     Tenderness: There is no abdominal tenderness.  Musculoskeletal:     Right lower leg: 1+ Pitting Edema present.     Left lower leg: 1+ Pitting Edema present.  Skin:    General: Skin is warm.     Findings: No rash.  Neurological:     General:  No focal deficit present.     Mental Status: He is alert.     Cranial Nerves: No cranial nerve deficit.  Psychiatric:        Mood and Affect: Mood normal.        Behavior: Behavior normal.        Thought Content: Thought content normal.        Judgment: Judgment normal.     No results found.  Recent Results (from the past 2160 hours)  POC HgB A1c     Status: Abnormal   Collection Time: 06/19/23 11:47 AM  Result Value Ref Range   Hemoglobin A1C 6.1 (A) 4.0 - 5.6 %   HbA1c POC (<> result, manual entry)     HbA1c, POC (prediabetic range)     HbA1c, POC (controlled diabetic range)          Garner Nash, MD, MS

## 2023-06-19 NOTE — Assessment & Plan Note (Signed)
Long-standing, stable currently managed with combinations of hydrochlorothiazide/losartan and Lasix  Plan:  Continue current medications

## 2023-06-19 NOTE — Assessment & Plan Note (Signed)
HbA1c at 6.1%, decision to discontinue metformin to observe impact of lifestyle modifications  Plan: Discontinue metformin Monitor fasting blood glucose at home a couple of times per week Follow-up in 1 month

## 2023-06-19 NOTE — Assessment & Plan Note (Signed)
Worsening.    Plan:  Referral to dietitian and weight management clinic Discussed weight loss medications Reginia Forts, Wegovy, Saxenda, Contrave); patient to check insurance coverage Encourage continued exercise (aim for 150 minutes per week) Start dietary modifications with guidance from dietitian

## 2023-06-19 NOTE — Patient Instructions (Addendum)
Check with your insurance if any of these medications are covered Contrave, Zepbound, Harmony, Saxenda, Qsymia, orlistat. If they are, we can start one. Contrave can be ordered without insurance coverage for $50 at a specialty pharmacy with a delivered to your home.  We are referring to nutrition for weight management.  We are also referring to weight loss clinic that can help [place] medically manage weight.  Stop taking metformin.  Monitor blood sugars fasting in the morning daily.  Follow-up in 1 month to check blood sugars and blood pressure on the combination losartan/hydrochlorothiazide

## 2023-06-19 NOTE — Assessment & Plan Note (Signed)
Referral to sleep medicine for follow-up on CPAP usage

## 2023-06-20 ENCOUNTER — Other Ambulatory Visit: Payer: Self-pay | Admitting: Family Medicine

## 2023-06-20 DIAGNOSIS — I10 Essential (primary) hypertension: Secondary | ICD-10-CM

## 2023-06-21 ENCOUNTER — Other Ambulatory Visit: Payer: Self-pay | Admitting: Family Medicine

## 2023-06-21 DIAGNOSIS — I10 Essential (primary) hypertension: Secondary | ICD-10-CM

## 2023-06-22 ENCOUNTER — Other Ambulatory Visit: Payer: Self-pay | Admitting: Family Medicine

## 2023-06-30 DIAGNOSIS — I10 Essential (primary) hypertension: Secondary | ICD-10-CM

## 2023-07-03 ENCOUNTER — Other Ambulatory Visit: Payer: Self-pay | Admitting: Family Medicine

## 2023-07-03 DIAGNOSIS — I10 Essential (primary) hypertension: Secondary | ICD-10-CM

## 2023-07-15 ENCOUNTER — Other Ambulatory Visit: Payer: Self-pay | Admitting: Family Medicine

## 2023-07-23 ENCOUNTER — Ambulatory Visit: Payer: BC Managed Care – PPO | Admitting: Family Medicine

## 2023-07-23 MED ORDER — LOSARTAN POTASSIUM 100 MG PO TABS: 100.0000 mg | ORAL_TABLET | Freq: Every day | ORAL | 0 refills | Status: DC

## 2023-07-23 MED ORDER — HYDROCHLOROTHIAZIDE 25 MG PO TABS: 25.0000 mg | ORAL_TABLET | Freq: Every day | ORAL | 3 refills | Status: DC

## 2023-08-01 ENCOUNTER — Ambulatory Visit (HOSPITAL_COMMUNITY): Admission: EM | Admit: 2023-08-01 | Discharge: 2023-08-01 | Disposition: A | Discharge status: Home / Self Care

## 2023-08-01 ENCOUNTER — Encounter (HOSPITAL_COMMUNITY): Payer: Self-pay

## 2023-08-01 DIAGNOSIS — J0100 Acute maxillary sinusitis, unspecified: Secondary | ICD-10-CM

## 2023-08-01 MED ORDER — AMOXICILLIN-POT CLAVULANATE 875-125 MG PO TABS: 1.0000 | ORAL_TABLET | Freq: Two times a day (BID) | ORAL | 0 refills | Status: DC

## 2023-08-01 MED ORDER — PREDNISONE 20 MG PO TABS: 40.0000 mg | ORAL_TABLET | Freq: Every day | ORAL | 0 refills | Status: AC

## 2023-08-01 NOTE — Discharge Instructions (Addendum)
 Start taking Augmentin twice daily for 7 days and prednisone one daily for 5 days for sinus infection. You can alternate between Tylenol and Ibuprofen as needed for pain and fever. I recommend Mucinex for cough and congestion as needed. Stay hydrated and get plenty of rest. Return here if symptoms persist or worsen.

## 2023-08-01 NOTE — ED Triage Notes (Signed)
 Chief Complaint: fever, runny nose, headaches, productive cough, fever, chest tightness with SOB.   Sick exposure: Yes- Son was sick, no diagnosis just a cold   Onset: 3-4 days   Prescriptions or OTC medications tried: Yes- Ibuprofen, tylenol, Robitussin, Mucinex     with little relief  New foods, medications, or products: No  Recent Travel: No

## 2023-08-01 NOTE — ED Provider Notes (Addendum)
 [facility]    CSN: [id number] Arrival date & time: 08/01/23  1134      History   Chief Complaint Chief Complaint  Patient presents with   Cough    HPI Ewel Lona is a 48 y.o. male.   Patient presented with headache, productive cough, fever, congestion, sinus pain/pressure, runny nose, and mild shortness of breath x 4 days.  Denies chest pain, abdominal pain, nausea, vomiting, and diarrhea.  Denies history of asthma or COPD.   Cough   Past Medical History:  Diagnosis Date   Diabetes mellitus without complication (HCC)    "pre-diabetic" 01/01/20   Hypertension    Lumbago     Patient Active Problem List   Diagnosis Date Noted   Dental caries 01/05/2023   Frequency of urination and polyuria 01/05/2023   Anasarca 12/09/2022   OSA (obstructive sleep apnea) 12/09/2022   Anemia 12/09/2022   Encounter for well adult exam without abnormal findings 12/09/2022   Statin intolerance 12/05/2022   Chronic low back pain without sciatica 11/03/2022   Chronic pain of right knee 11/03/2022   Class 3 severe obesity with serious comorbidity in adult [facility]) 11/03/2022   Vitamin D deficiency 11/03/2022   Hypokalemia 11/03/2022   Prediabetes 11/03/2022   Hyperlipidemia 11/03/2022   Peripheral edema 05/02/2019   Hypertension 05/02/2019    History reviewed. No pertinent surgical history.     Home Medications    Prior to Admission medications   Medication Sig Start Date End Date Taking? Authorizing Provider  Acetaminophen (TYLENOL 8 HOUR PO) Take by mouth.   Yes [provider]  amoxicillin-clavulanate (AUGMENTIN) 875-125 MG tablet Take 1 tablet by mouth every 12 (twelve) hours. 08/01/23  Yes Susann Givens, Shavonne Ambroise A, NP  fluticasone (FLONASE) 50 MCG/ACT nasal spray SPRAY 2 SPRAYS INTO EACH NOSTRIL EVERY DAY 06/22/23  Yes Garnette Gunner, MD  furosemide (LASIX) 40 MG tablet TAKE 1 TABLET BY MOUTH EVERY DAY 07/15/23  Yes Garnette Gunner, MD  hydrochlorothiazide  (HYDRODIURIL) 25 MG tablet Take 1 tablet (25 mg total) by mouth daily. 07/23/23  Yes Garnette Gunner, MD  ibuprofen (ADVIL) 600 MG tablet Take 1 tablet (600 mg total) by mouth every 8 (eight) hours as needed for moderate pain (pain score 4-6). 05/31/23  Yes Doreene Eland, MD  losartan (COZAAR) 100 MG tablet Take 1 tablet (100 mg total) by mouth daily. 07/23/23  Yes Garnette Gunner, MD  METFORMIN HCL PO Take by mouth.   Yes [provider]  MULTIPLE VITAMIN PO Take by mouth.   Yes [provider]  Omega-3 Fatty Acids (FISH OIL PO) Take by mouth.   Yes [provider]  potassium chloride SA (KLOR-CON M20) 20 MEQ tablet TAKE 1 TABLET BY MOUTH TWICE A DAY 05/22/23  Yes Garnette Gunner, MD  predniSONE (DELTASONE) 20 MG tablet Take 2 tablets (40 mg total) by mouth daily for 5 days. 08/01/23 08/06/23 Yes Wynonia Lawman A, NP  Blood Glucose Monitoring Suppl DEVI 1 each by Does not apply route in the morning, at noon, and at bedtime. May substitute to any manufacturer covered by patient's insurance. 06/19/23   Garnette Gunner, MD  Blood Pressure Monitoring (BLOOD PRESSURE KIT) KIT 1 each by Does not apply route daily. Patient not taking: Reported on 12/09/2022 11/03/22   Garnette Gunner, MD  potassium chloride (KLOR-CON) 10 MEQ tablet Take by mouth. 12/27/18 09/03/19  [provider]  rosuvastatin (CRESTOR) 10 MG tablet Take 1 tablet (  10 mg total) by mouth daily. 10/24/19 02/17/20  Eustace Moore, MD    Family History Family History  Problem Relation Age of Onset   Hypertension Mother    Heart failure Father     Social History Social History   Tobacco Use   Smoking status: Never    Passive exposure: Never   Smokeless tobacco: Never  Vaping Use   Vaping status: Never Used  Substance Use Topics   Alcohol use: Not Currently   Drug use: Never     Allergies   Ace inhibitors, Amlodipine, and Statins   Review of Systems Review of Systems   Respiratory:  Positive for cough.    Per HPI  Physical Exam Triage Vital Signs ED Triage Vitals  Encounter Vitals Group     BP 08/01/23 1200 (!) 178/81     Systolic BP Percentile --      Diastolic BP Percentile --      Pulse Rate 08/01/23 1200 94     Resp 08/01/23 1200 18     Temp 08/01/23 1200 98.6 F (37 C)     Temp Source 08/01/23 1200 Oral     SpO2 08/01/23 1200 95 %     Weight 08/01/23 1200 (!) 475 lb (215.5 kg)     Height 08/01/23 1200 6' (1.829 m)     Head Circumference --      Peak Flow --      Pain Score 08/01/23 1158 8     Pain Loc --      Pain Education --      Exclude from Growth Chart --    No data found.  Updated Vital Signs BP (!) 178/81 (BP Location: Left Wrist)   Pulse 94   Temp 98.6 F (37 C) (Oral)   Resp 18   Ht 6' (1.829 m)   Wt (!) 475 lb (215.5 kg)   SpO2 95%   BMI 64.42 kg/m   Visual Acuity Right Eye Distance:   Left Eye Distance:   Bilateral Distance:    Right Eye Near:   Left Eye Near:    Bilateral Near:     Physical Exam Vitals and nursing note reviewed.  Constitutional:      General: He is awake. He is not in acute distress.    Appearance: Normal appearance. He is well-developed and well-groomed. He is not ill-appearing.  HENT:     Right Ear: Tympanic membrane, ear canal and external ear normal.     Left Ear: Tympanic membrane, ear canal and external ear normal.     Nose: Congestion and rhinorrhea present.     Right Sinus: Maxillary sinus tenderness present. No frontal sinus tenderness.     Left Sinus: Maxillary sinus tenderness present. No frontal sinus tenderness.     Mouth/Throat:     Mouth: Mucous membranes are moist.     Pharynx: Posterior oropharyngeal erythema present. No oropharyngeal exudate.  Cardiovascular:     Rate and Rhythm: Normal rate and regular rhythm.  Pulmonary:     Effort: Pulmonary effort is normal.     Breath sounds: Normal breath sounds.  Musculoskeletal:        General: Normal range of motion.   Skin:    General: Skin is warm and dry.  Neurological:     Mental Status: He is alert.  Psychiatric:        Behavior: Behavior is cooperative.      UC Treatments / Results  Labs (all labs ordered  are listed, but only abnormal results are displayed) Labs Reviewed - No data to display  EKG   Radiology No results found.  Procedures Procedures (including critical care time)  Medications Ordered in UC Medications - No data to display  Initial Impression / Assessment and Plan / UC Course  I have reviewed the triage vital signs and the nursing notes.  Pertinent labs & imaging results that were available during my care of the patient were reviewed by me and considered in my medical decision making (see chart for details).     Patient presented with 4-day history of headache, productive cough, fever, congestion, sinus pain/pressure, runny nose, and mild shortness of breath.  Upon assessment congestion and rhinorrhea are present, mild erythema noted to pharynx.  Bilateral maxillary sinus tenderness noted.  Lungs clear bilaterally on auscultation.  Blood pressure was 170/81.  Patient states he has not taken his blood pressure medication today.  Prescribed Augmentin and prednisone for sinusitis.  Discussed over-the-counter medications for symptoms.  Discussed the importance of taking his blood pressure medication.  Discussed return precautions. Final Clinical Impressions(s) / UC Diagnoses   Final diagnoses:  Acute non-recurrent maxillary sinusitis     Discharge Instructions      Start taking Augmentin twice daily for 7 days and prednisone one daily for 5 days for sinus infection. You can alternate between Tylenol and Ibuprofen as needed for pain and fever. I recommend Mucinex for cough and congestion as needed. Stay hydrated and get plenty of rest. Return here if symptoms persist or worsen.      ED Prescriptions     Medication Sig Dispense Auth. Provider    amoxicillin-clavulanate (AUGMENTIN) 875-125 MG tablet Take 1 tablet by mouth every 12 (twelve) hours. 14 tablet Susann Givens, Batoul Limes A, NP   predniSONE (DELTASONE) 20 MG tablet Take 2 tablets (40 mg total) by mouth daily for 5 days. 10 tablet Wynonia Lawman A, NP      PDMP not reviewed this encounter.   Letta Kocher, NP 08/01/23 1344    Wynonia Lawman A, NP 08/01/23 1344

## 2023-08-05 ENCOUNTER — Encounter (INDEPENDENT_AMBULATORY_CARE_PROVIDER_SITE_OTHER): Payer: Self-pay

## 2023-08-19 ENCOUNTER — Encounter (HOSPITAL_COMMUNITY): Payer: Self-pay | Admitting: Emergency Medicine

## 2023-08-19 ENCOUNTER — Ambulatory Visit (HOSPITAL_COMMUNITY)
Admission: EM | Admit: 2023-08-19 | Discharge: 2023-08-19 | Disposition: A | Discharge status: Home / Self Care | Attending: Emergency Medicine | Admitting: Emergency Medicine

## 2023-08-19 DIAGNOSIS — R0981 Nasal congestion: Secondary | ICD-10-CM | POA: Diagnosis not present

## 2023-08-19 DIAGNOSIS — R053 Chronic cough: Secondary | ICD-10-CM | POA: Diagnosis not present

## 2023-08-19 MED ORDER — AZELASTINE HCL 0.1 % NA SOLN: 2.0000 | Freq: Two times a day (BID) | NASAL | 0 refills | Status: DC

## 2023-08-19 NOTE — ED Provider Notes (Signed)
 [facility]    CSN: [id number] Arrival date & time: 08/19/23  1018      History   Chief Complaint Chief Complaint  Patient presents with   Nasal Congestion   Cough   Ear Fullness    HPI Treven Holtman is a 48 y.o. male.   Patient presents with persistent congestion, mild dry cough, and ear fullness after being treated for sinus infection a couple weeks ago.  Denies fever, body aches, chills, and headache.  Denies taking anything for symptoms.   Cough Ear Fullness    Past Medical History:  Diagnosis Date   Diabetes mellitus without complication (HCC)    "pre-diabetic" 01/01/20   Hypertension    Lumbago     Patient Active Problem List   Diagnosis Date Noted   Dental caries 01/05/2023   Frequency of urination and polyuria 01/05/2023   Anasarca 12/09/2022   OSA (obstructive sleep apnea) 12/09/2022   Anemia 12/09/2022   Encounter for well adult exam without abnormal findings 12/09/2022   Statin intolerance 12/05/2022   Chronic low back pain without sciatica 11/03/2022   Chronic pain of right knee 11/03/2022   Class 3 severe obesity with serious comorbidity in adult [facility]) 11/03/2022   Vitamin D deficiency 11/03/2022   Hypokalemia 11/03/2022   Prediabetes 11/03/2022   Hyperlipidemia 11/03/2022   Peripheral edema 05/02/2019   Hypertension 05/02/2019    History reviewed. No pertinent surgical history.     Home Medications    Prior to Admission medications   Medication Sig Start Date End Date Taking? Authorizing Provider  azelastine (ASTELIN) 0.1 % nasal spray Place 2 sprays into both nostrils 2 (two) times daily. Use in each nostril as directed 08/19/23  Yes Wynonia Lawman A, NP  Acetaminophen (TYLENOL 8 HOUR PO) Take by mouth.    [provider]  amoxicillin-clavulanate (AUGMENTIN) 875-125 MG tablet Take 1 tablet by mouth every 12 (twelve) hours. 08/01/23   Wynonia Lawman A, NP  Blood Glucose Monitoring Suppl DEVI 1 each by Does  not apply route in the morning, at noon, and at bedtime. May substitute to any manufacturer covered by patient's insurance. 06/19/23   Garnette Gunner, MD  Blood Pressure Monitoring (BLOOD PRESSURE KIT) KIT 1 each by Does not apply route daily. Patient not taking: Reported on 12/09/2022 11/03/22   Garnette Gunner, MD  fluticasone [facility]) 50 MCG/ACT nasal spray SPRAY 2 SPRAYS INTO EACH NOSTRIL EVERY DAY 06/22/23   Garnette Gunner, MD  furosemide (LASIX) 40 MG tablet TAKE 1 TABLET BY MOUTH EVERY DAY 07/15/23   Garnette Gunner, MD  hydrochlorothiazide (HYDRODIURIL) 25 MG tablet Take 1 tablet (25 mg total) by mouth daily. 07/23/23   Garnette Gunner, MD  ibuprofen (ADVIL) 600 MG tablet Take 1 tablet (600 mg total) by mouth every 8 (eight) hours as needed for moderate pain (pain score 4-6). 05/31/23   Doreene Eland, MD  losartan (COZAAR) 100 MG tablet Take 1 tablet (100 mg total) by mouth daily. 07/23/23   Garnette Gunner, MD  METFORMIN HCL PO Take by mouth.    [provider]  MULTIPLE VITAMIN PO Take by mouth.    [provider]  Omega-3 Fatty Acids (FISH OIL PO) Take by mouth.    [provider]  potassium chloride SA (KLOR-CON M20) 20 MEQ tablet TAKE 1 TABLET BY MOUTH TWICE A DAY 05/22/23   Garnette Gunner, MD  potassium chloride (KLOR-CON) 10 MEQ tablet Take by mouth. 12/27/18  09/03/19  [provider]  rosuvastatin (CRESTOR) 10 MG tablet Take 1 tablet (10 mg total) by mouth daily. 10/24/19 02/17/20  Eustace Moore, MD    Family History Family History  Problem Relation Age of Onset   Hypertension Mother    Heart failure Father     Social History Social History   Tobacco Use   Smoking status: Never    Passive exposure: Never   Smokeless tobacco: Never  Vaping Use   Vaping status: Never Used  Substance Use Topics   Alcohol use: Not Currently   Drug use: Never     Allergies   Ace inhibitors, Amlodipine, and Statins   Review of  Systems Review of Systems  Respiratory:  Positive for cough.    Per HPI  Physical Exam Triage Vital Signs ED Triage Vitals  Encounter Vitals Group     BP 08/19/23 1030 138/73     Systolic BP Percentile --      Diastolic BP Percentile --      Pulse Rate 08/19/23 1030 96     Resp 08/19/23 1030 20     Temp 08/19/23 1030 98.4 F (36.9 C)     Temp Source 08/19/23 1030 Oral     SpO2 08/19/23 1030 96 %     Weight --      Height --      Head Circumference --      Peak Flow --      Pain Score 08/19/23 1029 0     Pain Loc --      Pain Education --      Exclude from Growth Chart --    No data found.  Updated Vital Signs BP 138/73 (BP Location: Right Arm)   Pulse 96   Temp 98.4 F (36.9 C) (Oral)   Resp 20   SpO2 96%   Visual Acuity Right Eye Distance:   Left Eye Distance:   Bilateral Distance:    Right Eye Near:   Left Eye Near:    Bilateral Near:     Physical Exam Vitals and nursing note reviewed.  Constitutional:      General: He is awake. He is not in acute distress.    Appearance: Normal appearance. He is well-developed and well-groomed. He is not ill-appearing.  HENT:     Right Ear: Tympanic membrane, ear canal and external ear normal.     Left Ear: Tympanic membrane, ear canal and external ear normal.     Nose: Congestion and rhinorrhea present.     Right Sinus: No maxillary sinus tenderness or frontal sinus tenderness.     Left Sinus: No maxillary sinus tenderness or frontal sinus tenderness.     Mouth/Throat:     Mouth: Mucous membranes are moist.     Pharynx: Posterior oropharyngeal erythema present. No oropharyngeal exudate.  Cardiovascular:     Rate and Rhythm: Normal rate and regular rhythm.  Pulmonary:     Effort: Pulmonary effort is normal.     Breath sounds: Normal breath sounds.  Neurological:     Mental Status: He is alert.  Psychiatric:        Behavior: Behavior is cooperative.      UC Treatments / Results  Labs (all labs ordered are  listed, but only abnormal results are displayed) Labs Reviewed - No data to display  EKG   Radiology No results found.  Procedures Procedures (including critical care time)  Medications Ordered in UC Medications - No data to  display  Initial Impression / Assessment and Plan / UC Course  I have reviewed the triage vital signs and the nursing notes.  Pertinent labs & imaging results that were available during my care of the patient were reviewed by me and considered in my medical decision making (see chart for details).     Upon assessment congestion and rhinorrhea present, mild erythema noted to pharynx.  Lungs clear bilaterally auscultation.  Prescribed azelastine nasal spray recommended using a daily allergy medication to assist with persistent congestion.  Discussed return precautions. Final Clinical Impressions(s) / UC Diagnoses   Final diagnoses:  Nasal congestion  Persistent dry cough     Discharge Instructions      Use azelastine nasal spray twice daily. Start taking a daily allergy medication such as Zyrtec or Claritin once daily. You can also get saline nasal spray over-the-counter to assist with congestion. Return here if symptoms persist or worsen.   ED Prescriptions     Medication Sig Dispense Auth. Provider   azelastine (ASTELIN) 0.1 % nasal spray Place 2 sprays into both nostrils 2 (two) times daily. Use in each nostril as directed 30 mL Wynonia Lawman A, NP      PDMP not reviewed this encounter.   Wynonia Lawman A, NP 08/19/23 1212

## 2023-08-19 NOTE — Discharge Instructions (Signed)
 Use azelastine nasal spray twice daily. Start taking a daily allergy medication such as Zyrtec or Claritin once daily. You can also get saline nasal spray over-the-counter to assist with congestion. Return here if symptoms persist or worsen.

## 2023-08-19 NOTE — ED Triage Notes (Signed)
 Reports was seen here couple weeks ago for sinus infection. Reports still has some cough and congestion as well as ears feel full. Reports some pain few days ago in ears.

## 2023-08-20 ENCOUNTER — Encounter (INDEPENDENT_AMBULATORY_CARE_PROVIDER_SITE_OTHER): Payer: Self-pay

## 2023-08-23 ENCOUNTER — Other Ambulatory Visit: Payer: Self-pay | Admitting: Family Medicine

## 2023-08-23 DIAGNOSIS — E876 Hypokalemia: Secondary | ICD-10-CM

## 2023-10-12 ENCOUNTER — Other Ambulatory Visit: Payer: Self-pay | Admitting: Family Medicine

## 2023-10-19 ENCOUNTER — Other Ambulatory Visit: Payer: Self-pay | Admitting: Family Medicine

## 2023-10-19 DIAGNOSIS — I10 Essential (primary) hypertension: Secondary | ICD-10-CM

## 2023-10-19 NOTE — Telephone Encounter (Signed)
 Pt RS for tomorrow 10/20/23.

## 2023-10-19 NOTE — Telephone Encounter (Signed)
 Copied from CRM [id number]. Topic: Clinical - Medication Question >> Oct 19, 2023  1:58 PM Albertha Alosa wrote: Reason for CRM: Patient called in regarding his prescription , was unable to get them refilled because he needed a appointment , has scheduled a appointment for 05/28 wanted to know if he will have to wait until appointment to get medication or can it be refilled.    [phone]

## 2023-10-20 ENCOUNTER — Encounter: Payer: Self-pay | Admitting: Family Medicine

## 2023-10-20 ENCOUNTER — Other Ambulatory Visit (HOSPITAL_COMMUNITY): Payer: Self-pay

## 2023-10-20 ENCOUNTER — Ambulatory Visit (INDEPENDENT_AMBULATORY_CARE_PROVIDER_SITE_OTHER): Admitting: Family Medicine

## 2023-10-20 ENCOUNTER — Telehealth: Payer: Self-pay

## 2023-10-20 VITALS — BP 156/82 | HR 83 | Temp 97.0°F | Resp 18 | Wt >= 6400 oz

## 2023-10-20 DIAGNOSIS — I872 Venous insufficiency (chronic) (peripheral): Secondary | ICD-10-CM

## 2023-10-20 DIAGNOSIS — D649 Anemia, unspecified: Secondary | ICD-10-CM

## 2023-10-20 DIAGNOSIS — E785 Hyperlipidemia, unspecified: Secondary | ICD-10-CM | POA: Diagnosis not present

## 2023-10-20 DIAGNOSIS — Z6841 Body Mass Index (BMI) 40.0 and over, adult: Secondary | ICD-10-CM

## 2023-10-20 DIAGNOSIS — I10 Essential (primary) hypertension: Secondary | ICD-10-CM | POA: Diagnosis not present

## 2023-10-20 DIAGNOSIS — H6992 Unspecified Eustachian tube disorder, left ear: Secondary | ICD-10-CM

## 2023-10-20 DIAGNOSIS — E876 Hypokalemia: Secondary | ICD-10-CM | POA: Diagnosis not present

## 2023-10-20 DIAGNOSIS — R7303 Prediabetes: Secondary | ICD-10-CM

## 2023-10-20 DIAGNOSIS — Z23 Encounter for immunization: Secondary | ICD-10-CM

## 2023-10-20 DIAGNOSIS — G4733 Obstructive sleep apnea (adult) (pediatric): Secondary | ICD-10-CM

## 2023-10-20 DIAGNOSIS — E66813 Obesity, class 3: Secondary | ICD-10-CM

## 2023-10-20 DIAGNOSIS — E559 Vitamin D deficiency, unspecified: Secondary | ICD-10-CM

## 2023-10-20 LAB — HEPATIC FUNCTION PANEL
ALT: 18 U/L (ref 0–53)
AST: 17 U/L (ref 0–37)
Albumin: 4 g/dL (ref 3.5–5.2)
Alkaline Phosphatase: 80 U/L (ref 39–117)
Bilirubin, Direct: 0.1 mg/dL (ref 0.0–0.3)
Total Bilirubin: 0.5 mg/dL (ref 0.2–1.2)
Total Protein: 7.3 g/dL (ref 6.0–8.3)

## 2023-10-20 LAB — CBC WITH DIFFERENTIAL/PLATELET
Basophils Absolute: 0 10*3/uL (ref 0.0–0.1)
Basophils Relative: 0.4 % (ref 0.0–3.0)
Eosinophils Absolute: 0.3 10*3/uL (ref 0.0–0.7)
Eosinophils Relative: 3.5 % (ref 0.0–5.0)
HCT: 38 % — ABNORMAL LOW (ref 39.0–52.0)
Hemoglobin: 12.6 g/dL — ABNORMAL LOW (ref 13.0–17.0)
Lymphocytes Relative: 26.9 % (ref 12.0–46.0)
Lymphs Abs: 2.2 10*3/uL (ref 0.7–4.0)
MCHC: 33.1 g/dL (ref 30.0–36.0)
MCV: 80.4 fl (ref 78.0–100.0)
Monocytes Absolute: 0.6 10*3/uL (ref 0.1–1.0)
Monocytes Relative: 6.8 % (ref 3.0–12.0)
Neutro Abs: 5.2 10*3/uL (ref 1.4–7.7)
Neutrophils Relative %: 62.4 % (ref 43.0–77.0)
Platelets: 326 10*3/uL (ref 150.0–400.0)
RBC: 4.73 Mil/uL (ref 4.22–5.81)
RDW: 15.2 % (ref 11.5–15.5)
WBC: 8.3 10*3/uL (ref 4.0–10.5)

## 2023-10-20 LAB — BASIC METABOLIC PANEL WITH GFR
BUN: 15 mg/dL (ref 6–23)
CO2: 30 meq/L (ref 19–32)
Calcium: 8.9 mg/dL (ref 8.4–10.5)
Chloride: 97 meq/L (ref 96–112)
Creatinine, Ser: 0.76 mg/dL (ref 0.40–1.50)
GFR: 105.26 mL/min (ref >60.00)
Glucose, Bld: 105 mg/dL — ABNORMAL HIGH (ref 70–99)
Potassium: 3.5 meq/L (ref 3.5–5.1)
Sodium: 138 meq/L (ref 135–145)

## 2023-10-20 LAB — MAGNESIUM: Magnesium: 1.8 mg/dL (ref 1.5–2.5)

## 2023-10-20 LAB — HEMOGLOBIN A1C: Hgb A1c MFr Bld: 6.3 % (ref 4.6–6.5)

## 2023-10-20 LAB — MICROALBUMIN / CREATININE URINE RATIO
Creatinine,U: 163.5 mg/dL
Microalb Creat Ratio: 13.1 mg/g (ref 0.0–30.0)
Microalb, Ur: 2.1 mg/dL — ABNORMAL HIGH (ref 0.0–1.9)

## 2023-10-20 LAB — FOLATE: Folate: >23.2 ng/mL (ref >5.9)

## 2023-10-20 LAB — FERRITIN: Ferritin: 127.1 ng/mL (ref 22.0–322.0)

## 2023-10-20 LAB — LIPID PANEL
Cholesterol: 237 mg/dL — ABNORMAL HIGH (ref 0–200)
HDL: 40.6 mg/dL (ref >39.00)
LDL Cholesterol: 163 mg/dL — ABNORMAL HIGH (ref 0–99)
NonHDL: 196.38
Total CHOL/HDL Ratio: 6
Triglycerides: 166 mg/dL — ABNORMAL HIGH (ref 0.0–149.0)
VLDL: 33.2 mg/dL (ref 0.0–40.0)

## 2023-10-20 LAB — VITAMIN B12: Vitamin B-12: 565 pg/mL (ref 211–911)

## 2023-10-20 MED ORDER — LOSARTAN POTASSIUM 100 MG PO TABS
100.0000 mg | ORAL_TABLET | Freq: Every day | ORAL | 0 refills | Status: DC
Start: 2023-10-20 — End: 2024-02-23

## 2023-10-20 MED ORDER — AZELASTINE HCL 0.1 % NA SOLN
2.0000 | Freq: Two times a day (BID) | NASAL | 0 refills | Status: AC
Start: 2023-10-20 — End: ?

## 2023-10-20 MED ORDER — SPIRONOLACTONE 50 MG PO TABS: 50.0000 mg | ORAL_TABLET | Freq: Every day | ORAL | 3 refills | Status: AC

## 2023-10-20 MED ORDER — ZEPBOUND 2.5 MG/0.5ML ~~LOC~~ SOAJ: 2.5000 mg | SUBCUTANEOUS | 0 refills | Status: DC

## 2023-10-20 MED ORDER — FLUTICASONE PROPIONATE 50 MCG/ACT NA SUSP
2.0000 | Freq: Every day | NASAL | 1 refills | Status: AC
Start: 2023-10-20 — End: ?

## 2023-10-20 MED ORDER — METFORMIN HCL ER 500 MG PO TB24
500.0000 mg | ORAL_TABLET | Freq: Every day | ORAL | 3 refills | Status: AC
Start: 2023-10-20 — End: 2024-10-14

## 2023-10-20 MED ORDER — HYDROCHLOROTHIAZIDE 25 MG PO TABS: 25.0000 mg | ORAL_TABLET | Freq: Every day | ORAL | 3 refills | Status: AC

## 2023-10-20 MED ORDER — POTASSIUM CHLORIDE CRYS ER 20 MEQ PO TBCR: 20.0000 meq | EXTENDED_RELEASE_TABLET | Freq: Two times a day (BID) | ORAL | 0 refills | Status: AC

## 2023-10-20 MED ORDER — PRAVASTATIN SODIUM 10 MG PO TABS
10.0000 mg | ORAL_TABLET | Freq: Every day | ORAL | 3 refills | Status: AC
Start: 2023-10-20 — End: 2024-10-14

## 2023-10-20 MED ORDER — FUROSEMIDE 40 MG PO TABS: 40.0000 mg | ORAL_TABLET | Freq: Every day | ORAL | 3 refills | Status: AC

## 2023-10-20 NOTE — Progress Notes (Signed)
 Assessment & Plan   Assessment/Plan:    Assessment and Plan Assessment & Plan Hypertension Hypertension remains elevated at 156/82 mmHg despite current regimen of losartan  100 mg, hydrochlorothiazide  25 mg, and Lasix  40 mg daily. Considering adding spironolactone to improve blood pressure control and address edema. Discussed potential benefits of spironolactone, including improved drainage and swelling reduction, and emphasized monitoring potassium levels due to hyperkalemia risk. Discussed dietary modifications as an alternative approach. - Add spironolactone 50 mg daily. - Order baseline lab work, including electrolytes. - Recheck potassium levels in one week. - Instruct to check blood pressure daily. - Schedule follow-up in one month to assess blood pressure control.  Venous insufficiency with edema Chronic venous insufficiency with persistent lower extremity edema managed with Lasix  40 mg daily. No prior vascular evaluation or ultrasound performed. He desires intervention to improve condition and quality of life. Discussed potential referral to vascular surgery if ultrasound indicates venous reflux. - Order lower extremity Doppler ultrasound to assess venous flow. - Refer to vascular surgery for further evaluation and management if indicated. - Continue Lasix  40 mg daily. - Encourage use of compression stockings.  Hypokalemia Hypokalemia managed with potassium chloride  20 mEq twice daily. Need to monitor potassium levels closely due to addition of spironolactone. Discussed importance of maintaining potassium balance to prevent cardiac and systemic symptoms. - Continue potassium chloride  20 mEq twice daily. - Monitor potassium levels closely with upcoming lab work.  Hyperlipidemia Statin intolerance with muscle aches. Previous trial of rosuvastatin  was unsuccessful. Considering trial of pravastatin, which has a lower risk of muscle-related side effects. Discussed potential cardiac  protection benefits of statins independent of cholesterol levels and emphasized monitoring for side effects and adjusting dosing frequency as needed. - Prescribe pravastatin 10 mg daily. - Monitor for any side effects and adjust dosing frequency if necessary.  Prediabetes Prediabetes managed with metformin  500 mg daily. Plan to assess glycemic control with A1c testing. Discussed importance of monitoring kidney function through urine analysis. - Continue metformin  500 mg daily. - Order hemoglobin A1c test. - Collect urine sample to assess kidney function.  Sleep apnea Sleep apnea with a BMI of 66.5. Discussed potential weight management options, including pharmacotherapy. He expressed interest in exploring medication coverage for weight management. Discussed potential coverage for Zepbound (tirzepatide) for sleep apnea and weight management. - Check insurance coverage for Zepbound (tirzepatide) for weight management. - Refer to pharmacy for medication assistance. - If covered, start Zepbound 2.5 mg weekly.  Eustachian tube dysfunction Left ear feels clogged with muffled hearing for several weeks. No signs of infection or significant abnormalities on examination. Possible eustachian tube dysfunction suspected. No associated allergy symptoms reported. Discussed use of nasal steroids to alleviate symptoms. - Prescribe Flonase  (fluticasone ) nasal spray, two sprays in each nostril once daily. - Refill azelastine  nasal spray. - Advise to use both nasal sprays concurrently. - Monitor symptoms and report if no improvement.  Immunization or tetanus needed - Tdap vaccination ministered  Vitamin D  deficiency secondary to obesity - Recheck vitamin D  levels      Medications Discontinued During This Encounter  Medication Reason   amoxicillin -clavulanate (AUGMENTIN ) 875-125 MG tablet    Blood Pressure Monitoring (BLOOD PRESSURE KIT) KIT    fluticasone  (FLONASE ) 50 MCG/ACT nasal spray Reorder    furosemide  (LASIX ) 40 MG tablet Reorder   losartan  (COZAAR ) 100 MG tablet Reorder   hydrochlorothiazide  (HYDRODIURIL ) 25 MG tablet Reorder   METFORMIN  HCL PO Reorder   azelastine  (ASTELIN ) 0.1 % nasal spray Reorder  potassium chloride  SA (KLOR-CON  M) 20 MEQ tablet Reorder    Return in about 1 month (around 11/20/2023) for BP, 1 week for lab work.        Subjective:   Encounter date: 10/20/2023  Tyler Carroll is a 48 y.o. male who has Peripheral edema; Hypertension; Chronic low back pain without sciatica; Chronic pain of right knee; Class 3 severe obesity with serious comorbidity in adult; Vitamin D  deficiency; Prediabetes; Hyperlipidemia; Statin intolerance; Anasarca; and OSA (obstructive sleep apnea) on their problem list..   He  has a past medical history of Anemia (12/09/2022), Dental caries (01/05/2023), Diabetes mellitus without complication (HCC), Frequency of urination and polyuria (01/05/2023), Hypertension, Hypokalemia (11/03/2022), and Lumbago.Tyler Carroll   He presents with chief complaint of Hypertension (Rx refills are needed //HM due- TDAP vaccine ) and Ear Fullness (Pt c/o of left ear clogging with muffling sounds for a couple of weeks ) .   Discussed the use of AI scribe software for clinical note transcription with the patient, who gave verbal consent to proceed.  History of Present Illness Tyler Carroll "Tyler Carroll" is a 48 year old male with hypertension and venous insufficiency who presents for follow-up on blood pressure management.  His blood pressure was measured at 156/82 today, which is elevated. He has not been checking his blood pressure at home. He is currently taking losartan  100 mg, hydrochlorothiazide  25 mg, and Lasix  40 mg daily without issues. However, the swelling in his legs due to venous insufficiency has not improved, impacting his ability to wear shorts, which he used to enjoy.  He has not seen a vascular doctor before and has not had any ultrasounds of his  legs to check for venous reflux.  No chest pain, shortness of breath, headaches, or blurry vision. He has a history of statin intolerance, experiencing muscle aches with rosuvastatin . He does not recall trying pravastatin before.  He reports a sensation of his left ear feeling clogged for several weeks, without associated allergy symptoms such as sneezing or itchy eyes. He uses a CPAP mask for sleep apnea and blows his nose daily. He experienced some dizziness when the ear issue first started but has since acclimated. He has used azelastine  nasal spray and antibiotics in the past for this issue and has run out of Flonase .  He is currently taking a potassium supplement, 20 mEq twice a day, due to a history of hypokalemia. He is also on metformin  500 mg daily for prediabetes. He has not yet connected with a dietitian or weight management program due to time constraints related to work. He wants to explore weight management options but has concerns about cost and insurance coverage.     ROS  History reviewed. No pertinent surgical history.  Outpatient Medications Prior to Visit  Medication Sig Dispense Refill   Acetaminophen (TYLENOL 8 HOUR PO) Take by mouth.     Blood Glucose Monitoring Suppl DEVI 1 each by Does not apply route in the morning, at noon, and at bedtime. May substitute to any manufacturer covered by patient's insurance. 1 each 0   ibuprofen  (ADVIL ) 600 MG tablet Take 1 tablet (600 mg total) by mouth every 8 (eight) hours as needed for moderate pain (pain score 4-6). 60 tablet 0   MULTIPLE VITAMIN PO Take by mouth.     Omega-3 Fatty Acids (FISH OIL PO) Take by mouth.     azelastine  (ASTELIN ) 0.1 % nasal spray Place 2 sprays into both nostrils 2 (two) times daily. Use in each  nostril as directed 30 mL 0   fluticasone  (FLONASE ) 50 MCG/ACT nasal spray SPRAY 2 SPRAYS INTO EACH NOSTRIL EVERY DAY 48 mL 1   furosemide  (LASIX ) 40 MG tablet TAKE 1 TABLET BY MOUTH EVERY DAY 90 tablet 0    hydrochlorothiazide  (HYDRODIURIL ) 25 MG tablet Take 1 tablet (25 mg total) by mouth daily. 90 tablet 3   losartan  (COZAAR ) 100 MG tablet Take 1 tablet (100 mg total) by mouth daily. 90 tablet 0   METFORMIN  HCL PO Take by mouth.     potassium chloride  SA (KLOR-CON  M) 20 MEQ tablet TAKE 1 TABLET BY MOUTH TWICE A DAY 180 tablet 0   amoxicillin -clavulanate (AUGMENTIN ) 875-125 MG tablet Take 1 tablet by mouth every 12 (twelve) hours. (Patient not taking: Reported on 10/20/2023) 14 tablet 0   Blood Pressure Monitoring (BLOOD PRESSURE KIT) KIT 1 each by Does not apply route daily. (Patient not taking: Reported on 10/20/2023) 1 kit 0   No facility-administered medications prior to visit.    Family History  Problem Relation Age of Onset   Hypertension Mother    Heart failure Father     Social History   Socioeconomic History   Marital status: Single    Spouse name: Not on file   Number of children: Not on file   Years of education: Not on file   Highest education level: Not on file  Occupational History   Not on file  Tobacco Use   Smoking status: Never    Passive exposure: Never   Smokeless tobacco: Never  Vaping Use   Vaping status: Never Used  Substance and Sexual Activity   Alcohol use: Not Currently   Drug use: Never   Sexual activity: Yes    Birth control/protection: None    Comment: Married  Other Topics Concern   Not on file  Social History Narrative   Not on file   Social Drivers of Health   Financial Resource Strain: Not on file  Food Insecurity: Not on file  Transportation Needs: Not on file  Physical Activity: Not on file  Stress: Not on file  Social Connections: Not on file  Intimate Partner Violence: Not on file                                                                                                  Objective:  Physical Exam: BP (!) 156/82 (BP Location: Right Arm, Patient Position: Sitting, Cuff Size: Large) Comment: recheck  Pulse 83   Temp (!)  97 F (36.1 C) (Temporal)   Resp 18   Wt (!) 490 lb 6.4 oz (222.4 kg)   SpO2 96%   BMI 66.51 kg/m   Wt Readings from Last 3 Encounters:  10/20/23 (!) 490 lb 6.4 oz (222.4 kg)  08/01/23 (!) 475 lb (215.5 kg)  06/19/23 (!) 483 lb 6.4 oz (219.3 kg)    Physical Exam VITALS: BP- 156/82 MEASUREMENTS: BMI- 66.5. GENERAL: Alert, cooperative, well developed, no acute distress HEENT: Normocephalic, normal oropharynx, moist mucous membranes, ears normal bilaterally with cerumen present in left ear CHEST: Clear to auscultation bilaterally,  no wheezes, rhonchi, or crackles CARDIOVASCULAR: Normal heart rate and rhythm, S1 and S2 normal without murmurs ABDOMEN: Soft, non-tender, non-distended, without organomegaly, normal bowel sounds EXTREMITIES: No cyanosis or edema NEUROLOGICAL: Cranial nerves grossly intact, moves all extremities without gross motor or sensory deficit   Physical Exam  No results found.  No results found for this or any previous visit (from the past 2160 hours).      Carnell Christian, MD, MS

## 2023-10-20 NOTE — Patient Instructions (Signed)
  VISIT SUMMARY: Today, you came in for a follow-up on your blood pressure management. Your blood pressure was elevated at 156/82. We discussed your current medications and the swelling in your legs due to venous insufficiency. You also mentioned a clogged sensation in your left ear and your interest in weight management options. We reviewed your current medications and made some adjustments to better manage your conditions.  YOUR PLAN: -HYPERTENSION: Hypertension means high blood pressure. Your blood pressure was elevated today, so we are adding spironolactone 50 mg daily to your current medications. We will also do baseline lab work, including checking your electrolytes, and recheck your potassium levels in one week. Please check your blood pressure daily and schedule a follow-up in one month.  -VENOUS INSUFFICIENCY WITH EDEMA: Venous insufficiency means that the veins in your legs have trouble sending blood back to your heart, causing swelling. We will order a Doppler ultrasound to assess the blood flow in your legs and may refer you to a vascular surgeon based on the results. Continue taking Lasix  40 mg daily and use compression stockings.  -HYPOKALEMIA: Hypokalemia means low potassium levels. You will continue taking potassium chloride  20 mEq twice daily. We will monitor your potassium levels closely with the upcoming lab work, especially since we are adding spironolactone.  -HYPERLIPIDEMIA: Hyperlipidemia means high levels of fats in your blood. You have had muscle aches with rosuvastatin , so we will try pravastatin 10 mg daily, which has a lower risk of muscle-related side effects. Monitor for any side effects and we will adjust the dose if necessary.  -PREDIABETES: Prediabetes means your blood sugar levels are higher than normal but not high enough to be classified as diabetes. Continue taking metformin  500 mg daily. We will do a hemoglobin A1c test to assess your blood sugar control and a urine  test to check your kidney function.  -SLEEP APNEA: Sleep apnea is a condition where your breathing stops and starts during sleep. Given your interest in weight management, we will check your insurance coverage for Zepbound (tirzepatide) and refer you to a pharmacy for assistance. If covered, you will start Zepbound 2.5 mg weekly.  -EUSTACHIAN TUBE DYSFUNCTION: Eustachian tube dysfunction means that the tube connecting your middle ear to the back of your nose is not working properly, causing a clogged sensation. We will prescribe Flonase  (fluticasone ) nasal spray, two sprays in each nostril once daily, and refill your azelastine  nasal spray. Use both sprays concurrently and monitor your symptoms.  INSTRUCTIONS: Please follow up in one month to assess your blood pressure control. Recheck your potassium levels in one week. Schedule a Doppler ultrasound for your legs and follow up with a vascular surgeon if needed. Continue monitoring your blood pressure daily and report any side effects from the new medications. Collect a urine sampl e and do a hemoglobin A1c test to assess your blood sugar control and kidney function.

## 2023-10-20 NOTE — Telephone Encounter (Signed)
 Pharmacy Patient Advocate Encounter   Received notification from Patient Pharmacy that prior authorization for Zepbound 2.5 is required/requested.   Insurance verification completed.   The patient is insured through [facility] .   Per test claim: PA required; PA submitted to above mentioned insurance via CoverMyMeds Key/confirmation #/EOC [id number] Status is pending

## 2023-10-21 NOTE — Telephone Encounter (Signed)
 Pharmacy Patient Advocate Encounter  Received notification from [facility] that Prior Authorization for Zepbound 2.5 has been DENIED.  Full denial letter will be uploaded to the media tab. See denial reason below.   PA #/Case ID/Reference #: [id number]

## 2023-10-22 ENCOUNTER — Ambulatory Visit: Payer: Self-pay | Admitting: Family Medicine

## 2023-10-22 DIAGNOSIS — D500 Iron deficiency anemia secondary to blood loss (chronic): Secondary | ICD-10-CM

## 2023-10-22 MED ORDER — POLYSACCHARIDE IRON COMPLEX 150 MG PO CAPS: 150.0000 mg | ORAL_CAPSULE | Freq: Every day | ORAL | 3 refills | Status: DC

## 2023-10-23 NOTE — Addendum Note (Signed)
 Addended by: Catheryn Cluck on: 10/23/2023 09:50 AM   Modules accepted: Orders

## 2023-10-24 LAB — VITAMIN D 1,25 DIHYDROXY
Vitamin D 1, 25 (OH)2 Total: 26 pg/mL (ref 18–72)
Vitamin D2 1, 25 (OH)2: <8 pg/mL
Vitamin D3 1, 25 (OH)2: 26 pg/mL

## 2023-10-24 LAB — THYROID PANEL WITH TSH
Free Thyroxine Index: 2.8 (ref 1.4–3.8)
T3 Uptake: 29 % (ref 22–35)
T4, Total: 9.7 ug/dL (ref 4.9–10.5)
TSH: 0.96 m[IU]/L (ref 0.40–4.50)

## 2023-10-24 LAB — IRON, TOTAL/TOTAL IRON BINDING CAP
%SAT: 18 % — ABNORMAL LOW (ref 20–48)
Iron: 45 ug/dL — ABNORMAL LOW (ref 50–180)
TIBC: 251 ug/dL (ref 250–425)

## 2023-10-27 ENCOUNTER — Other Ambulatory Visit

## 2023-10-27 ENCOUNTER — Telehealth: Payer: Self-pay

## 2023-10-27 NOTE — Progress Notes (Signed)
 Complex Care Management Note Care Guide Note  10/27/2023 Name: Tyler Carroll MRN: [id number] DOB: 1976-01-21   Complex Care Management Outreach Attempts: An unsuccessful telephone outreach was attempted today to offer the patient information about available complex care management services.  Follow Up Plan:  Additional outreach attempts will be made to offer the patient complex care management information and services.   Encounter Outcome:  No Answer  [facility]  [facility], [facility] Care Management Assistant Direct Dial: [phone]  Fax: [phone]

## 2023-10-28 ENCOUNTER — Ambulatory Visit: Admitting: Family Medicine

## 2023-10-29 NOTE — Progress Notes (Signed)
 Complex Care Management Note Care Guide Note  10/29/2023 Name: Tyler Carroll MRN: [id number] DOB: 05-22-1976   Complex Care Management Outreach Attempts: A second unsuccessful outreach was attempted today to offer the patient with information about available complex care management services.  Follow Up Plan:  Additional outreach attempts will be made to offer the patient complex care management information and services.   Encounter Outcome:  No Answer  [facility]  [facility], [facility] Care Management Assistant Direct Dial: [phone]  Fax: [phone]

## 2023-11-02 NOTE — Progress Notes (Signed)
 Complex Care Management Note  Care Guide Note 11/02/2023 Name: Tyler Carroll MRN: [id number] DOB: 09-14-1975  [name] is a 48 y.o. year old male who sees Catheryn Cluck, MD for primary care. I reached out to Davie Essex by phone today to offer complex care management services.  [name] was given information about Complex Care Management services today including:   The Complex Care Management services include support from the care team which includes your Nurse Care Manager, Clinical Social Worker, or Pharmacist.  The Complex Care Management team is here to help remove barriers to the health concerns and goals most important to you. Complex Care Management services are voluntary, and the patient may decline or stop services at any time by request to their care team member.   Complex Care Management Consent Status: Patient did not agree to participate in complex care management services at this time.  Follow up plan:  Patient will follow up with PCP.  Encounter Outcome:  Patient Refused  [name] [facility], [facility] Health Care Management Assistant Direct Dial: [phone]  Fax: [phone]

## 2023-11-13 ENCOUNTER — Other Ambulatory Visit: Payer: Self-pay | Admitting: Family Medicine

## 2023-11-13 DIAGNOSIS — H6992 Unspecified Eustachian tube disorder, left ear: Secondary | ICD-10-CM

## 2023-11-23 ENCOUNTER — Ambulatory Visit: Admitting: Family Medicine

## 2023-11-30 ENCOUNTER — Encounter (INDEPENDENT_AMBULATORY_CARE_PROVIDER_SITE_OTHER): Payer: Self-pay

## 2023-12-17 ENCOUNTER — Encounter (HOSPITAL_COMMUNITY): Payer: Self-pay

## 2023-12-29 ENCOUNTER — Ambulatory Visit: Admitting: Family Medicine

## 2024-01-14 ENCOUNTER — Telehealth (HOSPITAL_COMMUNITY): Payer: Self-pay

## 2024-01-14 NOTE — Telephone Encounter (Signed)
 Attempted to contact the patient to schedule VAS US .  No answer.  Left message.  Fourth Attempt. Provided  direct contact number for scheduling: [phone].  Since 10/21/23 attempted multiple contacts including sending a letter to address on file. No response from patient to any of these attempts. Given that there was no response received order will be canceled per protocol. If you have questions or cocnerns please reach out to the referring office [place] at [facility].  Documentation of attempted contacts:  01/14/24 lvm /dd 12/17/23 lvm - letter sent 11/20/23 lvm /fc 11/09/23 left message to schedule /dd 10/21/23 PATIENT WILL CB LATER TO SCH./FC

## 2024-01-22 ENCOUNTER — Ambulatory Visit (HOSPITAL_COMMUNITY): Admission: EM | Admit: 2024-01-22 | Discharge: 2024-01-22 | Disposition: A | Discharge status: Home / Self Care

## 2024-01-22 ENCOUNTER — Encounter (HOSPITAL_COMMUNITY): Payer: Self-pay

## 2024-01-22 DIAGNOSIS — L97516 Non-pressure chronic ulcer of other part of right foot with bone involvement without evidence of necrosis: Secondary | ICD-10-CM

## 2024-01-22 NOTE — ED Triage Notes (Signed)
 Patient here today with c/o a sore on the right foot X 5-6 days. Patient states that it started out as a blister but has been worsening everyday. Patient has been taking Tylenol and Ibuprofen  with some relief. Patient takes Metformin  and states that he is prediabetic.

## 2024-01-22 NOTE — Discharge Instructions (Addendum)
 Given severity of ulceration recommend further evaluation in the emergency department to rule out osteomyelitis.

## 2024-01-22 NOTE — ED Provider Notes (Signed)
 [facility]    CSN: [id number] Arrival date & time: 01/22/24  1710      History   Chief Complaint Chief Complaint  Patient presents with   Wound Check    HPI Tyler Carroll is a 48 y.o. male.   Patient presents to urgent care with right foot ulcers.  He reports 1 on the right small toe started about a week ago and then developed 1 on the right big toe a few days ago.  He reports they have both gradually worsened over the last few days.  He does wear compression socks.  It looks like PCP ordered vascular studies in May for venous insufficiency, these have not been done.  He is prediabetic.  Last A1c 6.  3.  Patient takes metformin  daily.    Past Medical History:  Diagnosis Date   Anemia 12/09/2022   Dental caries 01/05/2023   Diabetes mellitus without complication (HCC)    pre-diabetic 01/01/20   Frequency of urination and polyuria 01/05/2023   Hypertension    Hypokalemia 11/03/2022   Lumbago     Patient Active Problem List   Diagnosis Date Noted   Anasarca 12/09/2022   OSA (obstructive sleep apnea) 12/09/2022   Statin intolerance 12/05/2022   Chronic low back pain without sciatica 11/03/2022   Chronic pain of right knee 11/03/2022   Class 3 severe obesity with serious comorbidity in adult 11/03/2022   Vitamin D  deficiency 11/03/2022   Prediabetes 11/03/2022   Hyperlipidemia 11/03/2022   Peripheral edema 05/02/2019   Hypertension 05/02/2019    History reviewed. No pertinent surgical history.     Home Medications    Prior to Admission medications   Medication Sig Start Date End Date Taking? Authorizing Provider  Acetaminophen (TYLENOL 8 HOUR PO) Take by mouth.    [provider]  Azelastine  HCl 137 MCG/SPRAY SOLN PLACE 2 SPRAYS INTO BOTH NOSTRILS 2 (TWO) TIMES DAILY. USE IN [facility] NOSTRIL AS DIRECTED 11/16/23   Sebastian Beverley NOVAK, MD  Blood Glucose Monitoring Suppl DEVI 1 each by Does not apply route in the morning, at noon, and at bedtime.  May substitute to any manufacturer covered by patient's insurance. 06/19/23   Sebastian Beverley NOVAK, MD  fluticasone  (FLONASE ) 50 MCG/ACT nasal spray Place 2 sprays into both nostrils daily. 10/20/23   Sebastian Beverley NOVAK, MD  furosemide  (LASIX ) 40 MG tablet Take 1 tablet (40 mg total) by mouth daily. 10/20/23 10/14/24  Sebastian Beverley NOVAK, MD  hydrochlorothiazide  (HYDRODIURIL ) 25 MG tablet Take 1 tablet (25 mg total) by mouth daily. 10/20/23   Sebastian Beverley NOVAK, MD  ibuprofen  (ADVIL ) 600 MG tablet Take 1 tablet (600 mg total) by mouth every 8 (eight) hours as needed for moderate pain (pain score 4-6). 05/31/23   Anders Otto DASEN, MD  losartan  (COZAAR ) 100 MG tablet Take 1 tablet (100 mg total) by mouth daily. 10/20/23   Sebastian Beverley NOVAK, MD  metFORMIN  (GLUCOPHAGE -XR) 500 MG 24 hr tablet Take 1 tablet (500 mg total) by mouth daily with breakfast. 10/20/23 10/14/24  Sebastian Beverley NOVAK, MD  MULTIPLE VITAMIN PO Take by mouth.    [provider]  Omega-3 Fatty Acids (FISH OIL PO) Take by mouth.    [provider]  potassium chloride  SA (KLOR-CON  M) 20 MEQ tablet Take 1 tablet (20 mEq total) by mouth 2 (two) times daily. 10/20/23   Sebastian Beverley NOVAK, MD  pravastatin  (PRAVACHOL ) 10 MG tablet Take 1 tablet (10 mg total) by mouth daily. 10/20/23  10/14/24  Sebastian Beverley NOVAK, MD  spironolactone  (ALDACTONE ) 50 MG tablet Take 1 tablet (50 mg total) by mouth daily. 10/20/23 10/14/24  Sebastian Beverley NOVAK, MD  tirzepatide  (ZEPBOUND ) 2.5 MG/0.5ML Pen Inject 2.5 mg into the skin once a week. Patient not taking: Reported on 01/22/2024 10/20/23   Sebastian Beverley NOVAK, MD  potassium chloride  (KLOR-CON ) 10 MEQ tablet Take by mouth. 12/27/18 09/03/19  [provider]  rosuvastatin  (CRESTOR ) 10 MG tablet Take 1 tablet (10 mg total) by mouth daily. 10/24/19 02/17/20  Maranda Jamee Jacob, MD    Family History Family History  Problem Relation Age of Onset   Hypertension Mother    Heart failure Father     Social  History Social History   Tobacco Use   Smoking status: Never    Passive exposure: Never   Smokeless tobacco: Never  Vaping Use   Vaping status: Never Used  Substance Use Topics   Alcohol use: Not Currently   Drug use: Never     Allergies   Ace inhibitors, Amlodipine , and Statins   Review of Systems Review of Systems  Constitutional:  Negative for chills and fever.  HENT:  Negative for ear pain and sore throat.   Eyes:  Negative for pain and visual disturbance.  Respiratory:  Negative for cough and shortness of breath.   Cardiovascular:  Negative for chest pain and palpitations.  Gastrointestinal:  Negative for abdominal pain and vomiting.  Genitourinary:  Negative for dysuria and hematuria.  Musculoskeletal:  Negative for arthralgias and back pain.  Skin:  Positive for wound. Negative for color change and rash.  Neurological:  Negative for seizures and syncope.  All other systems reviewed and are negative.    Physical Exam Triage Vital Signs ED Triage Vitals  Encounter Vitals Group     BP 01/22/24 1820 (!) 159/78     Girls Systolic BP Percentile --      Girls Diastolic BP Percentile --      Boys Systolic BP Percentile --      Boys Diastolic BP Percentile --      Pulse Rate 01/22/24 1820 98     Resp 01/22/24 1820 16     Temp 01/22/24 1820 98.7 F (37.1 C)     Temp Source 01/22/24 1820 Oral     SpO2 01/22/24 1820 98 %     Weight --      Height --      Head Circumference --      Peak Flow --      Pain Score 01/22/24 1822 7     Pain Loc --      Pain Education --      Exclude from Growth Chart --    No data found.  Updated Vital Signs BP (!) 159/78 (BP Location: Right Arm)   Pulse 98   Temp 98.7 F (37.1 C) (Oral)   Resp 16   SpO2 98%   Visual Acuity Right Eye Distance:   Left Eye Distance:   Bilateral Distance:    Right Eye Near:   Left Eye Near:    Bilateral Near:     Physical Exam Vitals and nursing note reviewed.  Constitutional:       General: He is not in acute distress.    Appearance: He is well-developed.  HENT:     Head: Normocephalic and atraumatic.  Eyes:     Conjunctiva/sclera: Conjunctivae normal.  Cardiovascular:     Rate and Rhythm: Normal rate and regular  rhythm.     Heart sounds: No murmur heard. Pulmonary:     Effort: Pulmonary effort is normal. No respiratory distress.     Breath sounds: Normal breath sounds.  Abdominal:     Palpations: Abdomen is soft.     Tenderness: There is no abdominal tenderness.  Musculoskeletal:        General: No swelling.     Cervical back: Neck supple.  Feet:     Comments: Ulcer noted to right fifth digit with bone exposure, new or ulceration noted to big toe. Skin:    General: Skin is warm and dry.     Capillary Refill: Capillary refill takes less than 2 seconds.  Neurological:     Mental Status: He is alert.  Psychiatric:        Mood and Affect: Mood normal.      UC Treatments / Results  Labs (all labs ordered are listed, but only abnormal results are displayed) Labs Reviewed - No data to display  EKG   Radiology No results found.  Procedures Procedures (including critical care time)  Medications Ordered in UC Medications - No data to display  Initial Impression / Assessment and Plan / UC Course  I have reviewed the triage vital signs and the nursing notes.  Pertinent labs & imaging results that were available during my care of the patient were reviewed by me and considered in my medical decision making (see chart for details).     Given severity of ulceration recommend further evaluation in the emergency department today, need to rule out osteomyelitis. Final Clinical Impressions(s) / UC Diagnoses   Final diagnoses:  Ulcer of right foot with bone involvement without evidence of necrosis [facility])     Discharge Instructions      Given severity of ulceration recommend further evaluation in the emergency department to rule out  osteomyelitis.   ED Prescriptions   None    PDMP not reviewed this encounter.   Ward, Harlene PEDLAR, PA-C 01/22/24 1932

## 2024-01-22 NOTE — ED Notes (Signed)
 Patient is being discharged from the Urgent Care and sent to the Emergency Department via POV. Per Harlene Sink, PA, patient is in need of higher level of care due to wound infection with bone exposure. Patient is aware and verbalizes understanding of plan of care.  Vitals:   01/22/24 1820  BP: (!) 159/78  Pulse: 98  Resp: 16  Temp: 98.7 F (37.1 C)  SpO2: 98%

## 2024-01-23 ENCOUNTER — Emergency Department (HOSPITAL_COMMUNITY)

## 2024-01-23 ENCOUNTER — Inpatient Hospital Stay (HOSPITAL_COMMUNITY)
Admission: EM | Admit: 2024-01-23 | Discharge: 2024-01-25 | DRG: 617 | Disposition: A | Discharge status: Home / Self Care | Attending: Family Medicine | Admitting: Family Medicine

## 2024-01-23 ENCOUNTER — Other Ambulatory Visit: Payer: Self-pay

## 2024-01-23 ENCOUNTER — Encounter (HOSPITAL_COMMUNITY): Payer: Self-pay | Admitting: *Deleted

## 2024-01-23 DIAGNOSIS — E1169 Type 2 diabetes mellitus with other specified complication: Secondary | ICD-10-CM | POA: Diagnosis present

## 2024-01-23 DIAGNOSIS — L0390 Cellulitis, unspecified: Secondary | ICD-10-CM | POA: Diagnosis present

## 2024-01-23 DIAGNOSIS — E11621 Type 2 diabetes mellitus with foot ulcer: Secondary | ICD-10-CM | POA: Diagnosis present

## 2024-01-23 DIAGNOSIS — E785 Hyperlipidemia, unspecified: Secondary | ICD-10-CM | POA: Diagnosis present

## 2024-01-23 DIAGNOSIS — E66813 Obesity, class 3: Secondary | ICD-10-CM | POA: Diagnosis present

## 2024-01-23 DIAGNOSIS — Z8249 Family history of ischemic heart disease and other diseases of the circulatory system: Secondary | ICD-10-CM | POA: Diagnosis not present

## 2024-01-23 DIAGNOSIS — Z888 Allergy status to other drugs, medicaments and biological substances status: Secondary | ICD-10-CM | POA: Diagnosis not present

## 2024-01-23 DIAGNOSIS — Z79899 Other long term (current) drug therapy: Secondary | ICD-10-CM

## 2024-01-23 DIAGNOSIS — E669 Obesity, unspecified: Secondary | ICD-10-CM | POA: Diagnosis not present

## 2024-01-23 DIAGNOSIS — G4733 Obstructive sleep apnea (adult) (pediatric): Secondary | ICD-10-CM | POA: Diagnosis present

## 2024-01-23 DIAGNOSIS — M86171 Other acute osteomyelitis, right ankle and foot: Secondary | ICD-10-CM | POA: Diagnosis present

## 2024-01-23 DIAGNOSIS — R7303 Prediabetes: Secondary | ICD-10-CM | POA: Diagnosis present

## 2024-01-23 DIAGNOSIS — E1140 Type 2 diabetes mellitus with diabetic neuropathy, unspecified: Secondary | ICD-10-CM | POA: Diagnosis present

## 2024-01-23 DIAGNOSIS — S91104A Unspecified open wound of right lesser toe(s) without damage to nail, initial encounter: Secondary | ICD-10-CM | POA: Diagnosis present

## 2024-01-23 DIAGNOSIS — Z6841 Body Mass Index (BMI) 40.0 and over, adult: Secondary | ICD-10-CM | POA: Diagnosis not present

## 2024-01-23 DIAGNOSIS — Z7985 Long-term (current) use of injectable non-insulin antidiabetic drugs: Secondary | ICD-10-CM

## 2024-01-23 DIAGNOSIS — I10 Essential (primary) hypertension: Secondary | ICD-10-CM | POA: Diagnosis present

## 2024-01-23 DIAGNOSIS — I890 Lymphedema, not elsewhere classified: Secondary | ICD-10-CM | POA: Diagnosis present

## 2024-01-23 DIAGNOSIS — E876 Hypokalemia: Secondary | ICD-10-CM | POA: Diagnosis present

## 2024-01-23 DIAGNOSIS — Z7984 Long term (current) use of oral hypoglycemic drugs: Secondary | ICD-10-CM

## 2024-01-23 HISTORY — DX: Prediabetes: R73.03

## 2024-01-23 HISTORY — DX: Sleep apnea, unspecified: G47.30

## 2024-01-23 LAB — CBC WITH DIFFERENTIAL/PLATELET
Abs Immature Granulocytes: 0.03 K/uL (ref 0.00–0.07)
Basophils Absolute: 0 K/uL (ref 0.0–0.1)
Basophils Relative: 0 %
Eosinophils Absolute: 0.4 K/uL (ref 0.0–0.5)
Eosinophils Relative: 4 %
HCT: 40 % (ref 39.0–52.0)
Hemoglobin: 12.8 g/dL — ABNORMAL LOW (ref 13.0–17.0)
Immature Granulocytes: 0 %
Lymphocytes Relative: 25 %
Lymphs Abs: 2.4 K/uL (ref 0.7–4.0)
MCH: 26.8 pg (ref 26.0–34.0)
MCHC: 32 g/dL (ref 30.0–36.0)
MCV: 83.9 fL (ref 80.0–100.0)
Monocytes Absolute: 0.7 K/uL (ref 0.1–1.0)
Monocytes Relative: 7 %
Neutro Abs: 6.3 K/uL (ref 1.7–7.7)
Neutrophils Relative %: 64 %
Platelets: 384 K/uL (ref 150–400)
RBC: 4.77 MIL/uL (ref 4.22–5.81)
RDW: 14.2 % (ref 11.5–15.5)
WBC: 9.8 K/uL (ref 4.0–10.5)
nRBC: 0 % (ref 0.0–0.2)

## 2024-01-23 LAB — COMPREHENSIVE METABOLIC PANEL WITH GFR
ALT: 22 U/L (ref 0–44)
AST: 24 U/L (ref 15–41)
Albumin: 3.2 g/dL — ABNORMAL LOW (ref 3.5–5.0)
Alkaline Phosphatase: 89 U/L (ref 38–126)
Anion gap: 11 (ref 5–15)
BUN: 13 mg/dL (ref 6–20)
CO2: 27 mmol/L (ref 22–32)
Calcium: 9.1 mg/dL (ref 8.9–10.3)
Chloride: 97 mmol/L — ABNORMAL LOW (ref 98–111)
Creatinine, Ser: 0.96 mg/dL (ref 0.61–1.24)
GFR, Estimated: >60 mL/min (ref >60)
Glucose, Bld: 126 mg/dL — ABNORMAL HIGH (ref 70–99)
Potassium: 3 mmol/L — ABNORMAL LOW (ref 3.5–5.1)
Sodium: 135 mmol/L (ref 135–145)
Total Bilirubin: 0.4 mg/dL (ref 0.0–1.2)
Total Protein: 7.4 g/dL (ref 6.5–8.1)

## 2024-01-23 MED ORDER — GADOBUTROL 1 MMOL/ML IV SOLN
10.0000 mL | Freq: Once | INTRAVENOUS | Status: AC | PRN
  Administered 2024-01-23: 10 mL via INTRAVENOUS

## 2024-01-23 MED ORDER — IBUPROFEN 400 MG PO TABS
600.0000 mg | ORAL_TABLET | Freq: Once | ORAL | Status: AC
  Administered 2024-01-23: 600 mg via ORAL
  Filled 2024-01-23: qty 1

## 2024-01-23 MED ORDER — POTASSIUM CHLORIDE CRYS ER 20 MEQ PO TBCR
40.0000 meq | EXTENDED_RELEASE_TABLET | Freq: Once | ORAL | Status: AC
  Administered 2024-01-23: 40 meq via ORAL
  Filled 2024-01-23: qty 2

## 2024-01-23 MED ORDER — VANCOMYCIN HCL 2000 MG/400ML IV SOLN
2000.0000 mg | Freq: Once | INTRAVENOUS | Status: AC
  Administered 2024-01-23: 2000 mg via INTRAVENOUS
  Filled 2024-01-23: qty 400

## 2024-01-23 NOTE — Progress Notes (Signed)
 ED Pharmacy Antibiotic Sign Off An antibiotic consult was received from an ED provider for vancomycin  per pharmacy dosing for cellulitis. A chart review was completed to assess appropriateness.   The following one time order(s) were placed:  Vancomycin  2g IV x 1  Further antibiotic and/or antibiotic pharmacy consults should be ordered by the admitting provider if indicated.   Thank you for allowing pharmacy to be a part of this patient's care.   Dorn Poot, [facility]  Clinical Pharmacist 01/23/24 6:27 PM

## 2024-01-23 NOTE — ED Notes (Signed)
 Pt off unit to XR.

## 2024-01-23 NOTE — ED Notes (Signed)
 Pt provided a sandwich with provider approval

## 2024-01-23 NOTE — ED Triage Notes (Signed)
 States he was seen last pm at [facility] with what he thought was a blister on right foot. States he was told to come to the ED for further eval.

## 2024-01-23 NOTE — ED Provider Notes (Signed)
 [place] EMERGENCY DEPARTMENT AT [facility] Provider Note   CSN: [id number] Arrival date & time: 01/23/24  1417     Patient presents with: Foot Pain   Tyler Carroll is a 48 y.o. male.   Patient is a 48 year old male with a history of diabetes, hypertension, hyperlipidemia who presents with wounds on his right foot.  He states he noticed them about a week ago.  They seem to have gotten worse.  He went to an urgent care today and they sent him here for further evaluation.  He denies any known fevers.  He has chronic lymphedema for which he takes Lasix .  No prior history of foot ulcers.       Prior to Admission medications   Medication Sig Start Date End Date Taking? Authorizing Provider  Acetaminophen  (TYLENOL  8 HOUR PO) Take by mouth.    [provider]  Azelastine  HCl 137 MCG/SPRAY SOLN PLACE 2 SPRAYS INTO BOTH NOSTRILS 2 (TWO) TIMES DAILY. USE IN [facility] NOSTRIL AS DIRECTED 11/16/23   Sebastian Beverley NOVAK, MD  fluticasone  (FLONASE ) 50 MCG/ACT nasal spray Place 2 sprays into both nostrils daily. 10/20/23   Sebastian Beverley NOVAK, MD  furosemide  (LASIX ) 40 MG tablet Take 1 tablet (40 mg total) by mouth daily. 10/20/23 10/14/24  Sebastian Beverley NOVAK, MD  hydrochlorothiazide  (HYDRODIURIL ) 25 MG tablet Take 1 tablet (25 mg total) by mouth daily. 10/20/23   Sebastian Beverley NOVAK, MD  ibuprofen  (ADVIL ) 600 MG tablet Take 1 tablet (600 mg total) by mouth every 8 (eight) hours as needed for moderate pain (pain score 4-6). 05/31/23   Anders Otto DASEN, MD  losartan  (COZAAR ) 100 MG tablet Take 1 tablet (100 mg total) by mouth daily. 10/20/23   Sebastian Beverley NOVAK, MD  metFORMIN  (GLUCOPHAGE -XR) 500 MG 24 hr tablet Take 1 tablet (500 mg total) by mouth daily with breakfast. 10/20/23 10/14/24  Sebastian Beverley NOVAK, MD  MULTIPLE VITAMIN PO Take by mouth.    [provider]  Omega-3 Fatty Acids (FISH OIL PO) Take by mouth.    [provider]  potassium chloride  SA (KLOR-CON  M) 20 MEQ tablet  Take 1 tablet (20 mEq total) by mouth 2 (two) times daily. 10/20/23   Sebastian Beverley NOVAK, MD  pravastatin  (PRAVACHOL ) 10 MG tablet Take 1 tablet (10 mg total) by mouth daily. 10/20/23 10/14/24  Sebastian Beverley NOVAK, MD  spironolactone  (ALDACTONE ) 50 MG tablet Take 1 tablet (50 mg total) by mouth daily. 10/20/23 10/14/24  Sebastian Beverley NOVAK, MD  tirzepatide  (ZEPBOUND ) 2.5 MG/0.5ML Pen Inject 2.5 mg into the skin once a week. Patient not taking: Reported on 01/22/2024 10/20/23   Sebastian Beverley NOVAK, MD  potassium chloride  (KLOR-CON ) 10 MEQ tablet Take by mouth. 12/27/18 09/03/19  [provider]  rosuvastatin  (CRESTOR ) 10 MG tablet Take 1 tablet (10 mg total) by mouth daily. 10/24/19 02/17/20  Maranda Jamee Jacob, MD    Allergies: Ace inhibitors, Amlodipine , and Statins    Review of Systems  Constitutional:  Negative for chills, diaphoresis, fatigue and fever.  HENT:  Negative for congestion, rhinorrhea and sneezing.   Eyes: Negative.   Respiratory:  Negative for cough, chest tightness and shortness of breath.   Cardiovascular:  Negative for chest pain and leg swelling.  Gastrointestinal:  Negative for abdominal pain, diarrhea, nausea and vomiting.  Genitourinary:  Negative for difficulty urinating, flank pain and frequency.  Musculoskeletal:  Negative for arthralgias and back pain.  Skin:  Positive for wound. Negative for rash.  Neurological:  Negative for dizziness, speech difficulty, weakness, numbness and headaches.    Updated Vital Signs BP (!) 154/85 (BP Location: Left Arm)   Pulse 89   Temp 98.1 F (36.7 C) (Oral)   Resp 14   Ht 6' (1.829 m)   Wt (!) 208.7 kg   SpO2 95%   BMI 62.39 kg/m   Physical Exam Constitutional:      Appearance: He is well-developed. He is obese.  HENT:     Head: Normocephalic and atraumatic.  Eyes:     Pupils: Pupils are equal, round, and reactive to light.  Cardiovascular:     Rate and Rhythm: Normal rate and regular rhythm.     Heart sounds: Normal  heart sounds.  Pulmonary:     Effort: Pulmonary effort is normal. No respiratory distress.     Breath sounds: Normal breath sounds. No wheezing or rales.  Chest:     Chest wall: No tenderness.  Abdominal:     General: Bowel sounds are normal.     Palpations: Abdomen is soft.     Tenderness: There is no abdominal tenderness. There is no guarding or rebound.  Musculoskeletal:        General: Normal range of motion.     Cervical back: Normal range of motion and neck supple.     Comments: Patient has an ulcerated wound involving the fifth digit of the right foot.  There is some small amount of purulent drainage and a moderate amount of surrounding erythema.  There is a smaller more superficial appearing ulcer to the big toe.  No surrounding cellulitis noted at that area.  Pedal pulses intact.  He has normal sensation and motor function distally.  Lymphadenopathy:     Cervical: No cervical adenopathy.  Skin:    General: Skin is warm and dry.     Findings: No rash.  Neurological:     Mental Status: He is alert and oriented to person, place, and time.        (all labs ordered are listed, but only abnormal results are displayed) Labs Reviewed  CBC WITH DIFFERENTIAL/PLATELET - Abnormal; Notable for the following components:      Result Value   Hemoglobin 12.8 (*)    All other components within normal limits  COMPREHENSIVE METABOLIC PANEL WITH GFR - Abnormal; Notable for the following components:   Potassium 3.0 (*)    Chloride 97 (*)    Glucose, Bld 126 (*)    Albumin 3.2 (*)    All other components within normal limits    EKG: None  Radiology: MR FOOT RIGHT W WO CONTRAST Result Date: 01/23/2024 CLINICAL DATA:  Osteonecrosis suspect of the right foot. X-ray done. EXAM: MRI OF THE RIGHT FOREFOOT WITHOUT AND WITH CONTRAST TECHNIQUE: Multiplanar, multisequence MR imaging of the right forefoot was performed before and after the administration of intravenous contrast. CONTRAST:  10mL  GADAVIST  GADOBUTROL  1 MMOL/ML IV SOLN COMPARISON:  Right foot radiographs 01/23/2024 FINDINGS: Motion artifact limits the examination. Bones/Joint/Cartilage Abnormal marrow signal intensity demonstrated throughout the proximal and distal phalanges of the fifth toe. There is loss of fat T1 signal and increased T2 signal intensity with contrast enhancement demonstrated. This is consistent with osteomyelitis. Marrow signal intensities in the first through fourth toes appears normal. Degenerative changes demonstrated in the interphalangeal joints and first metatarsal-phalangeal joint. Ligaments Grossly intact appearance of the visualized ligaments. Muscles and Tendons Visualized flexor and extensor tendons appear grossly intact. No intramuscular mass or abscess demonstrated. Soft tissues  Fluid signal demonstrated throughout the dorsum of the foot consistent with soft tissue edema. No loculated collections are seen. Heterogeneous contrast enhancement demonstrated in the soft tissues around the fifth toe consistent with cellulitis. IMPRESSION: 1. Abnormal marrow signal intensity and contrast enhancement involving fifth toe consistent with osteomyelitis. 2. Soft tissue enhancement adjacent to the fifth toe likely due to cellulitis. No loculated abscess identified. 3. Diffuse subcutaneous soft tissue swelling over the dorsum of the forefoot. 4. Degenerative changes in the interphalangeal joints and first metatarsal-phalangeal joint. Electronically Signed   By: Elsie Gravely M.D.   On: 01/23/2024 21:39   DG Foot Complete Right Result Date: 01/23/2024 CLINICAL DATA:  Right foot wound.  Concern for osteomyelitis. EXAM: RIGHT FOOT COMPLETE - 3+ VIEW COMPARISON:  None Available. FINDINGS: No acute fracture or dislocation. No evidence of acute osteolysis or erosive changes. Suspected ulcerations overlying the medial first metatarsal head and the lateral fifth toe. No definite soft tissue gas. Mild degenerative arthropathy  first and fifth MTP and talonavicular joints. Os peroneum is noted. Calcaneal enthesopathy. Generalized subcutaneous edema of the right lower extremity and foot. IMPRESSION: 1. Suspected ulcerations overlying the medial first metatarsal head and the lateral fifth toe. No radiographic evidence to suggest acute osteomyelitis at this time. If there is persistent clinical concern, MRI could be obtained for further evaluation. 2. Mild degenerative arthropathy throughout the foot. 3. Generalized nonspecific subcutaneous edema of the right lower extremity and foot. Electronically Signed   By: Harrietta Sherry M.D.   On: 01/23/2024 16:58     Procedures   Medications Ordered in the ED  potassium chloride  SA (KLOR-CON  M) CR tablet 40 mEq (40 mEq Oral Given 01/23/24 1614)  vancomycin  (VANCOREADY) IVPB 2000 mg/400 mL (2,000 mg Intravenous New Bag/Given 01/23/24 1850)  gadobutrol  (GADAVIST ) 1 MMOL/ML injection 10 mL (10 mLs Intravenous Contrast Given 01/23/24 2033)  ibuprofen  (ADVIL ) tablet 600 mg (600 mg Oral Given 01/23/24 2151)                                    Medical Decision Making Amount and/or Complexity of Data Reviewed Labs: ordered. Radiology: ordered.  Risk Prescription drug management. Decision regarding hospitalization.   This patient presents to the ED for concern of foot ulcers, this involves an extensive number of treatment options, and is a complaint that carries with it a high risk of complications and morbidity.  I considered the following differential and admission for this acute, potentially life threatening condition.  The differential diagnosis includes diabetic ulcer, vascular insufficiency, arterial insufficiency, cellulitis, osteomyelitis  MDM:    Patient is a 48 year old who presents with 2 foot ulcers.  The larger one is to his right pinky toe.  There is some small amount of surrounding cellulitis.  He was started on IV antibiotics.  His labs show a slightly low potassium.   He was given some oral potassium supplementation.  X-rays do not show any evidence of osteomyelitis.  MRI was performed which does show evidence of osteomyelitis in the fifth digit.  I consulted with Dr. Laurence who will admit the patient for further treatment.  (Labs, imaging, consults)  Labs: I Ordered, and personally interpreted labs.  The pertinent results include: Low potassium, normal white count  Imaging Studies ordered: I ordered imaging studies including x-ray right foot, MRI right foot I independently visualized and interpreted imaging. I agree with the radiologist interpretation  Additional history obtained  from chart.  External records from outside source obtained and reviewed including prior notes  Cardiac Monitoring: The patient is not maintained on a cardiac monitor.  If on the cardiac monitor, I personally viewed and interpreted the cardiac monitored which showed an underlying rhythm of:    Reevaluation: After the interventions noted above, I reevaluated the patient and found that they have :stayed the same  Social Determinants of Health:    Disposition: Admit to hospital  Co morbidities that complicate the patient evaluation  Past Medical History:  Diagnosis Date   Anemia 12/09/2022   Dental caries 01/05/2023   Diabetes mellitus without complication (HCC)    pre-diabetic 01/01/20   Frequency of urination and polyuria 01/05/2023   Hypertension    Hypokalemia 11/03/2022   Lumbago      Medicines Meds ordered this encounter  Medications   potassium chloride  SA (KLOR-CON  M) CR tablet 40 mEq   vancomycin  (VANCOREADY) IVPB 2000 mg/400 mL    Indication::   Cellulitis   gadobutrol  (GADAVIST ) 1 MMOL/ML injection 10 mL   ibuprofen  (ADVIL ) tablet 600 mg    I have reviewed the patients home medicines and have made adjustments as needed  Problem List / ED Course: Problem List Items Addressed This Visit   None Visit Diagnoses       Other acute osteomyelitis of  right foot (HCC)    -  Primary   Relevant Medications   vancomycin  (VANCOREADY) IVPB 2000 mg/400 mL (Completed)                Final diagnoses:  Other acute osteomyelitis of right foot [facility])    ED Discharge Orders     None          Lenor Hollering, MD 01/23/24 2233

## 2024-01-24 ENCOUNTER — Inpatient Hospital Stay (HOSPITAL_COMMUNITY): Admitting: Certified Registered Nurse Anesthetist

## 2024-01-24 ENCOUNTER — Encounter (HOSPITAL_COMMUNITY)
Admission: EM | Disposition: A | Discharge status: Home / Self Care | Payer: Self-pay | Source: Home / Self Care | Attending: Family Medicine

## 2024-01-24 ENCOUNTER — Encounter (HOSPITAL_COMMUNITY): Payer: Self-pay | Admitting: Internal Medicine

## 2024-01-24 DIAGNOSIS — M86171 Other acute osteomyelitis, right ankle and foot: Secondary | ICD-10-CM

## 2024-01-24 DIAGNOSIS — I10 Essential (primary) hypertension: Secondary | ICD-10-CM

## 2024-01-24 DIAGNOSIS — G4733 Obstructive sleep apnea (adult) (pediatric): Secondary | ICD-10-CM

## 2024-01-24 DIAGNOSIS — I890 Lymphedema, not elsewhere classified: Secondary | ICD-10-CM

## 2024-01-24 HISTORY — PX: AMPUTATION TOE: SHX6595

## 2024-01-24 LAB — GLUCOSE, CAPILLARY
Glucose-Capillary: 111 mg/dL — ABNORMAL HIGH (ref 70–99)
Glucose-Capillary: 114 mg/dL — ABNORMAL HIGH (ref 70–99)
Glucose-Capillary: 128 mg/dL — ABNORMAL HIGH (ref 70–99)
Glucose-Capillary: 143 mg/dL — ABNORMAL HIGH (ref 70–99)
Glucose-Capillary: 162 mg/dL — ABNORMAL HIGH (ref 70–99)

## 2024-01-24 LAB — HEMOGLOBIN A1C
Hgb A1c MFr Bld: 6.2 % — ABNORMAL HIGH (ref 4.8–5.6)
Mean Plasma Glucose: 131.24 mg/dL

## 2024-01-24 LAB — SURGICAL PCR SCREEN
MRSA, PCR: POSITIVE — AB
Staphylococcus aureus: POSITIVE — AB

## 2024-01-24 LAB — CBC WITH DIFFERENTIAL/PLATELET
Abs Immature Granulocytes: 0.03 K/uL (ref 0.00–0.07)
Basophils Absolute: 0 K/uL (ref 0.0–0.1)
Basophils Relative: 1 %
Eosinophils Absolute: 0.3 K/uL (ref 0.0–0.5)
Eosinophils Relative: 4 %
HCT: 36.5 % — ABNORMAL LOW (ref 39.0–52.0)
Hemoglobin: 11.8 g/dL — ABNORMAL LOW (ref 13.0–17.0)
Immature Granulocytes: 0 %
Lymphocytes Relative: 27 %
Lymphs Abs: 2.1 K/uL (ref 0.7–4.0)
MCH: 26.4 pg (ref 26.0–34.0)
MCHC: 32.3 g/dL (ref 30.0–36.0)
MCV: 81.7 fL (ref 80.0–100.0)
Monocytes Absolute: 0.7 K/uL (ref 0.1–1.0)
Monocytes Relative: 9 %
Neutro Abs: 4.7 K/uL (ref 1.7–7.7)
Neutrophils Relative %: 59 %
Platelets: 333 K/uL (ref 150–400)
RBC: 4.47 MIL/uL (ref 4.22–5.81)
RDW: 14.4 % (ref 11.5–15.5)
WBC: 7.9 K/uL (ref 4.0–10.5)
nRBC: 0 % (ref 0.0–0.2)

## 2024-01-24 LAB — BASIC METABOLIC PANEL WITH GFR
Anion gap: 10 (ref 5–15)
BUN: 9 mg/dL (ref 6–20)
CO2: 26 mmol/L (ref 22–32)
Calcium: 8.5 mg/dL — ABNORMAL LOW (ref 8.9–10.3)
Chloride: 101 mmol/L (ref 98–111)
Creatinine, Ser: 0.86 mg/dL (ref 0.61–1.24)
GFR, Estimated: >60 mL/min (ref >60)
Glucose, Bld: 134 mg/dL — ABNORMAL HIGH (ref 70–99)
Potassium: 3.6 mmol/L (ref 3.5–5.1)
Sodium: 137 mmol/L (ref 135–145)

## 2024-01-24 LAB — HIV ANTIBODY (ROUTINE TESTING W REFLEX): HIV Screen 4th Generation wRfx: NONREACTIVE

## 2024-01-24 LAB — C-REACTIVE PROTEIN: CRP: 2.2 mg/dL — ABNORMAL HIGH (ref <1.0)

## 2024-01-24 LAB — PREALBUMIN: Prealbumin: 23 mg/dL (ref 18–38)

## 2024-01-24 LAB — SEDIMENTATION RATE: Sed Rate: 46 mm/h — ABNORMAL HIGH (ref 0–16)

## 2024-01-24 SURGERY — AMPUTATION, TOE
Anesthesia: Monitor Anesthesia Care | Site: Toe | Laterality: Right

## 2024-01-24 MED ORDER — SODIUM CHLORIDE 0.9 % IV SOLN
2.0000 g | INTRAVENOUS | Status: DC
  Administered 2024-01-24 – 2024-01-25 (×2): 2 g via INTRAVENOUS
  Filled 2024-01-24 (×2): qty 20

## 2024-01-24 MED ORDER — PRAVASTATIN SODIUM 10 MG PO TABS
10.0000 mg | ORAL_TABLET | Freq: Every evening | ORAL | Status: DC
  Administered 2024-01-24 – 2024-01-25 (×2): 10 mg via ORAL
  Filled 2024-01-24 (×2): qty 1

## 2024-01-24 MED ORDER — HYDRALAZINE HCL 10 MG PO TABS
10.0000 mg | ORAL_TABLET | Freq: Three times a day (TID) | ORAL | Status: DC
  Administered 2024-01-24 – 2024-01-25 (×4): 10 mg via ORAL
  Filled 2024-01-24 (×9): qty 1

## 2024-01-24 MED ORDER — HEPARIN SODIUM (PORCINE) 5000 UNIT/ML IJ SOLN
5000.0000 [IU] | Freq: Three times a day (TID) | INTRAMUSCULAR | Status: DC
  Administered 2024-01-24 – 2024-01-25 (×5): 5000 [IU] via SUBCUTANEOUS
  Filled 2024-01-24 (×5): qty 1

## 2024-01-24 MED ORDER — METRONIDAZOLE 500 MG PO TABS
500.0000 mg | ORAL_TABLET | Freq: Two times a day (BID) | ORAL | Status: DC
  Administered 2024-01-24 – 2024-01-25 (×4): 500 mg via ORAL
  Filled 2024-01-24 (×4): qty 1

## 2024-01-24 MED ORDER — BUPIVACAINE HCL (PF) 0.5 % IJ SOLN
INTRAMUSCULAR | Status: AC
  Filled 2024-01-24: qty 30

## 2024-01-24 MED ORDER — INSULIN ASPART 100 UNIT/ML IJ SOLN
0.0000 [IU] | Freq: Three times a day (TID) | INTRAMUSCULAR | Status: DC
  Administered 2024-01-24: 2 [IU] via SUBCUTANEOUS
  Administered 2024-01-25: 1 [IU] via SUBCUTANEOUS

## 2024-01-24 MED ORDER — FLUTICASONE PROPIONATE 50 MCG/ACT NA SUSP
1.0000 | Freq: Every evening | NASAL | Status: DC
  Administered 2024-01-24: 1 via NASAL
  Filled 2024-01-24: qty 16

## 2024-01-24 MED ORDER — MUPIROCIN 2 % EX OINT
1.0000 | TOPICAL_OINTMENT | Freq: Two times a day (BID) | CUTANEOUS | Status: DC
  Administered 2024-01-24 – 2024-01-25 (×2): 1 via NASAL
  Filled 2024-01-24 (×2): qty 22

## 2024-01-24 MED ORDER — ONDANSETRON HCL 4 MG/2ML IJ SOLN
INTRAMUSCULAR | Status: DC | PRN
  Administered 2024-01-24: 4 mg via INTRAVENOUS

## 2024-01-24 MED ORDER — FENTANYL CITRATE (PF) 250 MCG/5ML IJ SOLN
INTRAMUSCULAR | Status: AC
  Filled 2024-01-24: qty 5

## 2024-01-24 MED ORDER — CHLORHEXIDINE GLUCONATE 0.12 % MT SOLN
15.0000 mL | Freq: Once | OROMUCOSAL | Status: AC
  Administered 2024-01-24: 15 mL via OROMUCOSAL

## 2024-01-24 MED ORDER — SENNOSIDES-DOCUSATE SODIUM 8.6-50 MG PO TABS: 2.0000 | ORAL_TABLET | Freq: Two times a day (BID) | ORAL | Status: DC | PRN

## 2024-01-24 MED ORDER — ORAL CARE MOUTH RINSE: 15.0000 mL | Freq: Once | OROMUCOSAL | Status: AC

## 2024-01-24 MED ORDER — LOSARTAN POTASSIUM 50 MG PO TABS
100.0000 mg | ORAL_TABLET | Freq: Every evening | ORAL | Status: DC
  Administered 2024-01-24 – 2024-01-25 (×2): 100 mg via ORAL
  Filled 2024-01-24 (×2): qty 2

## 2024-01-24 MED ORDER — OXYCODONE HCL 5 MG PO TABS: 5.0000 mg | ORAL_TABLET | ORAL | 0 refills | Status: DC | PRN

## 2024-01-24 MED ORDER — MIDAZOLAM HCL 2 MG/2ML IJ SOLN
INTRAMUSCULAR | Status: DC | PRN
  Administered 2024-01-24: 2 mg via INTRAVENOUS

## 2024-01-24 MED ORDER — CHLORHEXIDINE GLUCONATE 4 % EX SOLN: 60.0000 mL | Freq: Once | CUTANEOUS | Status: DC

## 2024-01-24 MED ORDER — CEFAZOLIN SODIUM-DEXTROSE 2-4 GM/100ML-% IV SOLN
INTRAVENOUS | Status: AC
Start: 2024-01-24 — End: 2024-01-24
  Filled 2024-01-24: qty 100

## 2024-01-24 MED ORDER — FENTANYL CITRATE (PF) 100 MCG/2ML IJ SOLN: 25.0000 ug | INTRAMUSCULAR | Status: DC | PRN

## 2024-01-24 MED ORDER — PROPOFOL 10 MG/ML IV BOLUS
INTRAVENOUS | Status: AC
  Filled 2024-01-24: qty 20

## 2024-01-24 MED ORDER — ACETAMINOPHEN 10 MG/ML IV SOLN
INTRAVENOUS | Status: DC | PRN
  Administered 2024-01-24: 1000 mg via INTRAVENOUS

## 2024-01-24 MED ORDER — CHLORHEXIDINE GLUCONATE CLOTH 2 % EX PADS
6.0000 | MEDICATED_PAD | Freq: Every day | CUTANEOUS | Status: DC
  Administered 2024-01-25: 6 via TOPICAL

## 2024-01-24 MED ORDER — DEXMEDETOMIDINE HCL IN NACL 80 MCG/20ML IV SOLN
INTRAVENOUS | Status: DC | PRN
Start: 2024-01-24 — End: 2024-01-24
  Administered 2024-01-24 (×2): 10 ug via INTRAVENOUS
  Administered 2024-01-24: 8 ug via INTRAVENOUS

## 2024-01-24 MED ORDER — BUPIVACAINE HCL (PF) 0.5 % IJ SOLN
INTRAMUSCULAR | Status: DC | PRN
  Administered 2024-01-24: 10 mL

## 2024-01-24 MED ORDER — VANCOMYCIN HCL 10 G IV SOLR
2250.0000 mg | Freq: Two times a day (BID) | INTRAVENOUS | Status: DC
  Administered 2024-01-24 – 2024-01-25 (×3): 2250 mg via INTRAVENOUS
  Filled 2024-01-24 (×2): qty 22.5
  Filled 2024-01-24: qty 2250
  Filled 2024-01-24: qty 40

## 2024-01-24 MED ORDER — KETAMINE HCL 50 MG/5ML IJ SOSY
PREFILLED_SYRINGE | INTRAMUSCULAR | Status: AC
  Filled 2024-01-24: qty 5

## 2024-01-24 MED ORDER — LIDOCAINE 2% (20 MG/ML) 5 ML SYRINGE
INTRAMUSCULAR | Status: AC
Start: 2024-01-24 — End: 2024-01-24
  Filled 2024-01-24: qty 5

## 2024-01-24 MED ORDER — PROPOFOL 500 MG/50ML IV EMUL
INTRAVENOUS | Status: DC | PRN
  Administered 2024-01-24: 100 ug/kg/min via INTRAVENOUS

## 2024-01-24 MED ORDER — ONDANSETRON HCL 4 MG/2ML IJ SOLN: 4.0000 mg | Freq: Once | INTRAMUSCULAR | Status: DC | PRN

## 2024-01-24 MED ORDER — VANCOMYCIN HCL 500 MG IV SOLR
INTRAVENOUS | Status: AC
  Filled 2024-01-24: qty 10

## 2024-01-24 MED ORDER — CHLORHEXIDINE GLUCONATE 0.12 % MT SOLN
OROMUCOSAL | Status: AC
  Filled 2024-01-24: qty 15

## 2024-01-24 MED ORDER — LACTATED RINGERS IV SOLN: INTRAVENOUS | Status: DC

## 2024-01-24 MED ORDER — 0.9 % SODIUM CHLORIDE (POUR BTL) OPTIME
TOPICAL | Status: DC | PRN
  Administered 2024-01-24: 1000 mL

## 2024-01-24 MED ORDER — ONDANSETRON HCL 4 MG/2ML IJ SOLN: 4.0000 mg | Freq: Four times a day (QID) | INTRAMUSCULAR | Status: DC | PRN

## 2024-01-24 MED ORDER — ONDANSETRON HCL 4 MG PO TABS
4.0000 mg | ORAL_TABLET | Freq: Four times a day (QID) | ORAL | Status: DC | PRN
Start: 2024-01-24 — End: 2024-01-25
  Administered 2024-01-24: 4 mg via ORAL
  Filled 2024-01-24: qty 1

## 2024-01-24 MED ORDER — OXYCODONE HCL 5 MG PO TABS
5.0000 mg | ORAL_TABLET | ORAL | Status: DC | PRN
  Administered 2024-01-25 (×2): 5 mg via ORAL
  Filled 2024-01-24 (×2): qty 1

## 2024-01-24 MED ORDER — POTASSIUM CHLORIDE CRYS ER 20 MEQ PO TBCR
40.0000 meq | EXTENDED_RELEASE_TABLET | ORAL | Status: AC
  Administered 2024-01-24 (×3): 40 meq via ORAL
  Filled 2024-01-24 (×3): qty 2

## 2024-01-24 MED ORDER — VANCOMYCIN HCL 500 MG IV SOLR
INTRAVENOUS | Status: DC | PRN
  Administered 2024-01-24: 500 mg via TOPICAL

## 2024-01-24 MED ORDER — CEFAZOLIN SODIUM-DEXTROSE 3-4 GM/150ML-% IV SOLN
3.0000 g | INTRAVENOUS | Status: AC
  Administered 2024-01-24: 3 g via INTRAVENOUS

## 2024-01-24 MED ORDER — HYDROCHLOROTHIAZIDE 25 MG PO TABS
25.0000 mg | ORAL_TABLET | Freq: Every evening | ORAL | Status: DC
Start: 2024-01-24 — End: 2024-01-25
  Administered 2024-01-24 – 2024-01-25 (×2): 25 mg via ORAL
  Filled 2024-01-24 (×2): qty 1

## 2024-01-24 MED ORDER — PNEUMOCOCCAL 20-VAL CONJ VACC 0.5 ML IM SUSY
0.5000 mL | PREFILLED_SYRINGE | INTRAMUSCULAR | Status: DC
  Filled 2024-01-24: qty 0.5

## 2024-01-24 MED ORDER — ACETAMINOPHEN 10 MG/ML IV SOLN
INTRAVENOUS | Status: AC
  Filled 2024-01-24: qty 100

## 2024-01-24 MED ORDER — ACETAMINOPHEN 325 MG PO TABS
650.0000 mg | ORAL_TABLET | Freq: Four times a day (QID) | ORAL | Status: DC | PRN
  Administered 2024-01-24: 650 mg via ORAL
  Filled 2024-01-24: qty 2

## 2024-01-24 MED ORDER — ACETAMINOPHEN 650 MG RE SUPP: 650.0000 mg | Freq: Four times a day (QID) | RECTAL | Status: DC | PRN

## 2024-01-24 MED ORDER — CEFAZOLIN SODIUM-DEXTROSE 3-4 GM/150ML-% IV SOLN
INTRAVENOUS | Status: AC
Start: 2024-01-24 — End: 2024-01-24
  Filled 2024-01-24: qty 150

## 2024-01-24 MED ORDER — SODIUM CHLORIDE 0.9 % IR SOLN
Status: DC | PRN
  Administered 2024-01-24: 3000 mL

## 2024-01-24 MED ORDER — MIDAZOLAM HCL 2 MG/2ML IJ SOLN
INTRAMUSCULAR | Status: AC
  Filled 2024-01-24: qty 2

## 2024-01-24 SURGICAL SUPPLY — 32 items
BAG COUNTER SPONGE SURGICOUNT (BAG) ×1 IMPLANT
BLADE AVERAGE 25X9 (BLADE) IMPLANT
BLADE SURG 21 STRL SS (BLADE) ×1 IMPLANT
BNDG COHESIVE 4X5 TAN STRL LF (GAUZE/BANDAGES/DRESSINGS) ×1 IMPLANT
BNDG COHESIVE 6X5 TAN NS LF (GAUZE/BANDAGES/DRESSINGS) IMPLANT
BNDG ELASTIC 4INX 5YD STR LF (GAUZE/BANDAGES/DRESSINGS) IMPLANT
BNDG GAUZE DERMACEA FLUFF 4 (GAUZE/BANDAGES/DRESSINGS) ×1 IMPLANT
CLEANSER WND VASHE 34 (WOUND CARE) IMPLANT
COVER SURGICAL LIGHT HANDLE (MISCELLANEOUS) ×1 IMPLANT
DRAPE U-SHAPE 47X51 STRL (DRAPES) ×1 IMPLANT
DRSG ADAPTIC 3X8 NADH LF (GAUZE/BANDAGES/DRESSINGS) IMPLANT
DRSG MEPITEL 4X7.2 (GAUZE/BANDAGES/DRESSINGS) IMPLANT
DURAPREP 26ML APPLICATOR (WOUND CARE) ×1 IMPLANT
ELECTRODE REM PT RTRN 9FT ADLT (ELECTROSURGICAL) ×1 IMPLANT
GAUZE PAD ABD 8X10 STRL (GAUZE/BANDAGES/DRESSINGS) ×1 IMPLANT
GAUZE SPONGE 4X4 12PLY STRL (GAUZE/BANDAGES/DRESSINGS) IMPLANT
GLOVE BIOGEL PI IND STRL 9 (GLOVE) ×1 IMPLANT
GLOVE SURG ORTHO 9.0 STRL STRW (GLOVE) ×1 IMPLANT
GOWN STRL REUS W/ TWL XL LVL3 (GOWN DISPOSABLE) ×2 IMPLANT
KIT BASIN OR (CUSTOM PROCEDURE TRAY) ×1 IMPLANT
KIT TURNOVER KIT B (KITS) ×1 IMPLANT
MANIFOLD NEPTUNE II (INSTRUMENTS) ×1 IMPLANT
NDL 22X1.5 STRL (OR ONLY) (MISCELLANEOUS) IMPLANT
NEEDLE 22X1.5 STRL (OR ONLY) (MISCELLANEOUS) IMPLANT
NS IRRIG 1000ML POUR BTL (IV SOLUTION) ×1 IMPLANT
PACK ORTHO EXTREMITY (CUSTOM PROCEDURE TRAY) ×1 IMPLANT
PAD ABD 8X10 STRL (GAUZE/BANDAGES/DRESSINGS) IMPLANT
PAD ARMBOARD POSITIONER FOAM (MISCELLANEOUS) ×2 IMPLANT
SUT ETHILON 2 0 PSLX (SUTURE) ×1 IMPLANT
SUT PROLENE 2 0 CT2 30 (SUTURE) IMPLANT
SYR CONTROL 10ML LL (SYRINGE) IMPLANT
TOWEL GREEN STERILE (TOWEL DISPOSABLE) ×1 IMPLANT

## 2024-01-24 NOTE — H&P (Signed)
 H&P Update:  -History and Physical Reviewed  -Patient has been re-examined  -No change in the plan of care - discussed right 5th toe amp with possible 5th ray amp  -The risks and benefits were presented and reviewed. The risks due to hardware/suture failure and/or irritation, new/persistent infection, stiffness, nerve/vessel/tendon injury or rerupture of repaired tendon, nonunion/malunion, allograft usage, wound healing issues, development of arthritis, failure of this surgery, possibility of external fixation with delayed definitive surgery, need for further surgery, thromboembolic events, anesthesia/medical complications, amputation, death among others were discussed. The patient acknowledged the explanation, agreed to proceed with the plan and a consent was signed.  Tyler Carroll

## 2024-01-24 NOTE — Progress Notes (Signed)
 Pharmacy Antibiotic Note  Tyler Carroll is a 48 y.o. male admitted on 01/23/2024 with right foot wound.  Pharmacy has been consulted for Vancomycin  dosing for osteomyelitis.Vancomycin  2000 mg IV x1 given in ED 8/23 at 18:50.   01/23/24 MRI right foot Abnormal marrow signal intensity and contrast enhancement involving fifth toe consistent with osteomyelitis. 2. Soft tissue enhancement adjacent to the fifth toe likely due to cellulitis. No loculated abscess identified.   Plan: Vancomycin  2250 mg IV Q 12  hrs. Goal AUC 400-550. Expected AUC: 465 SCr used: 0.96,  Vd used: 0.5 L/kg   Height: 6' (182.9 cm) Weight: (!) 208.7 kg (460 lb) IBW/kg (Calculated) : 77.6  Temp (24hrs), Avg:98.2 F (36.8 C), Min:98.1 F (36.7 C), Max:98.3 F (36.8 C)  Recent Labs  Lab 01/23/24 1428  WBC 9.8  CREATININE 0.96    Estimated Creatinine Clearance: 169.3 mL/min (by C-G formula based on SCr of 0.96 mg/dL).    Allergies  Allergen Reactions   Statins Other (See Comments)    Myalgia all over   Ace Inhibitors Cough   Amlodipine  Other (See Comments)    Fatigue    Thank you for allowing pharmacy to be a part of this patient's care.   Levorn Gaskins, RPh Clinical Pharmacist 01/24/2024 12:30 AM

## 2024-01-24 NOTE — Care Plan (Addendum)
 Orthopaedic Surgery Plan of Care Note   -history and imaging reviewed with requesting team (Hospitalist service) -pt has right 5th toe osteomyelitis with full thickness ulceration -PMH includes morbid obesity (BMI 66), hypertension, prediabetes, bilateral lower extremity lymphedema -please keep NPO and hold VTE ppx for OR today around noon to perform right 5th toe amputation vs 5th ray partial amp -ok to do under MAC/local vs General as per anesthesia preference   Lillia Mountain, MD Orthopaedic Surgery [facility]

## 2024-01-24 NOTE — Assessment & Plan Note (Addendum)
 01-23-2024 check A1c.

## 2024-01-24 NOTE — Plan of Care (Signed)
 Pt admitted to the floor, vitals taken orders received  Problem: Education: Goal: Knowledge of General Education information will improve Description: Including pain rating scale, medication(s)/side effects and non-pharmacologic comfort measures Outcome: Progressing   Problem: Clinical Measurements: Goal: Ability to maintain clinical measurements within normal limits will improve Outcome: Progressing Goal: Will remain free from infection Outcome: Progressing Goal: Diagnostic test results will improve Outcome: Progressing Goal: Respiratory complications will improve Outcome: Progressing Goal: Cardiovascular complication will be avoided Outcome: Progressing

## 2024-01-24 NOTE — Assessment & Plan Note (Addendum)
 Body mass index is 62.39 kg/m.

## 2024-01-24 NOTE — Plan of Care (Signed)
   Problem: Clinical Measurements: Goal: Will remain free from infection Outcome: Progressing Goal: Diagnostic test results will improve Outcome: Progressing   Problem: Activity: Goal: Risk for activity intolerance will decrease Outcome: Progressing

## 2024-01-24 NOTE — Op Note (Signed)
 01/23/2024 - 01/24/2024  1:20 PM   PATIENT: Tyler Carroll  48 y.o. male  MRN: [id number]   PRE-OPERATIVE DIAGNOSIS:   Acute osteomyelitis of fifth ray of right foot   POST-OPERATIVE DIAGNOSIS:   Acute osteomyelitis of fifth ray of right foot   PROCEDURE: Right foot 5th ray partial amputation   SURGEON:  Lillia Mountain, MD   ASSISTANT: None   ANESTHESIA: General, regional   EBL: 5 cc   TOURNIQUET:   None used   COMPLICATIONS: None apparent   DISPOSITION: Extubated, awake and stable to recovery.   INDICATION FOR PROCEDURE: The patient presented with above diagnosis.  We discussed the diagnosis, alternative treatment options, risks and benefits of the above surgical intervention, as well as alternative non-operative treatments. All questions/concerns were addressed and the patient/family demonstrated appropriate understanding of the diagnosis, the procedure, the postoperative course, and overall prognosis. The patient wished to proceed with surgical intervention and signed an informed surgical consent as such, in each others presence prior to surgery.   PROCEDURE IN DETAIL: After preoperative consent was obtained and the correct operative site was identified, the patient was brought to the operating room supine on stretcher and transferred onto operating table. General anesthesia was induced. Preoperative antibiotics were administered. Surgical timeout was taken. The patient was then positioned supine with an ipsilateral hip bump. The operative lower extremity was prepped and draped in standard sterile fashion.  Level of amputation was determined by soft tissue status to be the fifth MTP joint as confirmed by intraoperative examination. A standard racket incision was made in healthy skin area at the level of the fifth metatarsophalangeal joint. Dissection was carried down sharply to the proximal phalanx. The surrounding tendons and other soft tissues were  carefully transected and the collateral ligaments of the MTP joint released. The infected toe was thus excised and sent as specimen for bone biopsy/intraoperative cultures. The bone was noted to be unhealthy and easily fragmented to light pressure. A partial 5th ray amputation was then performed.  We then irrigated the surgical site thoroughly with 3 L of normal saline using cysto tubing. Healthy bleeding noted throughout the amputation stump site. There were no signs of infection remaining in the surgical site following amputation. Hemostasis was carefully obtained. Mild steady bleeding observed and noted to be appropriate for surgical site. Vancomycin  powder was placed in the amputation site.    The skin was closed with zero tension using 2-0 PDS suture. The incision was noted to be dry upon closure. Overall, the operative foot was noted to be significantly less erythematous with decreased swelling.   The leg was cleaned with saline and sterile dressings were applied. The patient was awakened from anesthesia and transported to the recovery room in stable condition.    FOLLOW UP PLAN: -transfer to PACU, then return to RNF -strict heel WB operative extremity, maximum elevation -daily dressing changes by bedside nursing starting POD1 -trend intra-op cx -DVT ppx per primary team -pain rx printed in chart -follow up as outpatient within 7-10 days for wound check  -sutures out in 2-3 weeks in outpatient office   RADIOGRAPHS: AP, lateral, oblique radiographs of the right foot were obtained intraoperatively. These showed interval partial 5th ray amputation. No other acute injuries are noted.   Lillia Mountain Orthopaedic Surgery [facility]

## 2024-01-24 NOTE — Consult Note (Signed)
 WOC consulted for foot wound; patient to the OR per orthopedics today Will not consult for that reason  Tyler Carroll [facility] [address], CNS, CWON-AP [phone]

## 2024-01-24 NOTE — Anesthesia Preprocedure Evaluation (Signed)
 Anesthesia Evaluation  Patient identified by MRN, date of birth, ID band Patient awake    Reviewed: Allergy & Precautions, NPO status , Patient's Chart, lab work & pertinent test results  Airway Mallampati: III  TM Distance: >3 FB Neck ROM: Full    Dental  (+) Teeth Intact, Dental Advisory Given   Pulmonary sleep apnea    Pulmonary exam normal breath sounds clear to auscultation       Cardiovascular hypertension, Pt. on medications Normal cardiovascular exam Rhythm:Regular Rate:Normal     Neuro/Psych negative neurological ROS  negative psych ROS   GI/Hepatic negative GI ROS, Neg liver ROS,,,  Endo/Other  diabetes, Type 2, Oral Hypoglycemic Agents  Class 4 obesity  Renal/GU negative Renal ROS     Musculoskeletal Acute osteomyelitis of toe of right foot   Abdominal   Peds  Hematology  (+) Blood dyscrasia, anemia   Anesthesia Other Findings Day of surgery medications reviewed with the patient.  Reproductive/Obstetrics                              Anesthesia Physical Anesthesia Plan  ASA: 4  Anesthesia Plan: MAC   Post-op Pain Management: Minimal or no pain anticipated   Induction: Intravenous  PONV Risk Score and Plan: 1 and TIVA and Treatment may vary due to age or medical condition  Airway Management Planned: Natural Airway and Simple Face Mask  Additional Equipment:   Intra-op Plan:   Post-operative Plan:   Informed Consent: I have reviewed the patients History and Physical, chart, labs and discussed the procedure including the risks, benefits and alternatives for the proposed anesthesia with the patient or authorized representative who has indicated his/her understanding and acceptance.     Dental advisory given  Plan Discussed with: CRNA  Anesthesia Plan Comments:          Anesthesia Quick Evaluation

## 2024-01-24 NOTE — Progress Notes (Signed)
 Subjective: * Day of Surgery * Procedure(s) (LRB): AMPUTATION, TOE (Right)  Patient reports pain as appropriately controlled. Denies any new numbness/tingling.   Objective:   VITALS:  Temp:  [97.6 F (36.4 C)-98.2 F (36.8 C)] 98 F (36.7 C) (08/24 2000) Pulse Rate:  [75-92] 92 (08/24 2000) Resp:  [16-28] 16 (08/24 2000) BP: (112-151)/(58-99) 132/65 (08/24 2000) SpO2:  [92 %-99 %] 96 % (08/24 2000) Weight:  [219.1 kg] 219.1 kg (08/24 1232)  Gen: AAOx3, NAD Comfortable at rest  Right Lower Extremity: Skin intact except full thickness purulent ulcer over lateral aspect of 5th MTP joint, superficial pressure injury over medial 1st MTP TTP over 5th toe and met head SILT and motor intact to baseline throughout CR < 2s    LABS Recent Labs    01/23/24 1428 01/24/24 0847  HGB 12.8* 11.8*  WBC 9.8 7.9  PLT 384 333   Recent Labs    01/23/24 1428 01/24/24 2127  NA 135 137  K 3.0* 3.6  CL 97* 101  CO2 27 26  BUN 13 9  CREATININE 0.96 0.86  GLUCOSE 126* 134*   No results for input(s): LABPT, INR in the last 72 hours.   Assessment/Plan: * Day of Surgery * Procedure(s) (LRB): AMPUTATION, TOE (Right)  -history and imaging reviewed with requesting team (Hospitalist service) -pt has right 5th toe osteomyelitis with full thickness ulceration -PMH includes morbid obesity (BMI 66), hypertension, prediabetes, bilateral lower extremity lymphedema -please keep NPO and hold VTE ppx for OR today around noon to perform right 5th toe amputation vs 5th ray partial amp -ok to do under MAC/local vs General as per anesthesia preference  Lillia Mountain 01/24/2024, 10:46 PM  The risks and benefits were presented and reviewed. The risks due to suture failure/irritation, new/persistent/recurrent infection, stiffness, nerve/vessel/tendon injury, nonunion/malunion of any fracture, wound healing issues, allograft usage, development of arthritis, failure of this surgery, possibility  of external fixation in certain situations, possibility of delayed definitive surgery, need for further surgery, prolonged wound care including further soft tissue coverage procedures, thromboembolic events, anesthesia/medical complications/events perioperatively and beyond, amputation, death among others were discussed. The patient acknowledged the explanation, agreed to proceed with the plan and a consent was signed.

## 2024-01-24 NOTE — Assessment & Plan Note (Addendum)
 01-23-2024 given his need for IV antibiotics to treat his acute osteomyelitis, will hold his Lasix , hydrochlorothiazide  and Aldactone  for now.  Monitor his blood pressure.  Will also hold his ARB since he is going to be on concomitant vancomycin .  Switch to low-dose hydralazine  for now 10 mg 3 times daily.

## 2024-01-24 NOTE — Progress Notes (Signed)
   01/24/24 2302  BiPAP/CPAP/SIPAP  $ Non-Invasive Home Ventilator  Initial  $ Face Mask Large  Yes  BiPAP/CPAP/SIPAP Pt Type Adult  BiPAP/CPAP/SIPAP Resmed  Mask Type Full face mask  Mask Size Large  IPAP 16 cmH20  EPAP 8 cmH2O  FiO2 (%) 21 %  Patient Home Machine No  Patient Home Mask No  Patient Home Tubing No  Auto Titrate No  Device Plugged into RED Power Outlet Yes

## 2024-01-24 NOTE — Hospital Course (Addendum)
 48 y.o. M with MO, HTN, presented with right toe infection.  MRI in the ER confirmed osteomyelitis.

## 2024-01-24 NOTE — Consult Note (Signed)
 Reason for Consult: Right diabetic ulceration to the fifth toe Referring Physician: Dr. [name] is an 48 y.o. male.  HPI: Patient was admitted with right fifth toe wound in the setting of diabetes with neuropathy and MRI evidence of osteomyelitis of the fifth toe.  Orthopedics was consulted.  Patient seen at bedside.  He states the wound has been present for about a week.  He denies pain.  He denies any systemic symptoms.  He is unsure if he had any previous wounds.    Past Medical History:  Diagnosis Date   Anemia 12/09/2022   Dental caries 01/05/2023   Diabetes mellitus without complication (HCC)    pre-diabetic 01/01/20   Frequency of urination and polyuria 01/05/2023   Hypertension    Hypokalemia 11/03/2022   Lumbago     History reviewed. No pertinent surgical history.  Family History  Problem Relation Age of Onset   Hypertension Mother    Heart failure Father     Social History:  reports that he has never smoked. He has never been exposed to tobacco smoke. He has never used smokeless tobacco. He reports that he does not currently use alcohol. He reports that he does not use drugs.  Allergies:  Allergies  Allergen Reactions   Statins Other (See Comments)    Myalgia all over   Ace Inhibitors Cough   Amlodipine  Other (See Comments)    Fatigue    Medications: I have reviewed the patient's current medications.  Results for orders placed or performed during the hospital encounter of 01/23/24 (from the past 48 hours)  CBC with Differential     Status: Abnormal   Collection Time: 01/23/24  2:28 PM  Result Value Ref Range   WBC 9.8 4.0 - 10.5 K/uL   RBC 4.77 4.22 - 5.81 MIL/uL   Hemoglobin 12.8 (L) 13.0 - 17.0 g/dL   HCT 59.9 60.9 - 47.9 %   MCV 83.9 80.0 - 100.0 fL   MCH 26.8 26.0 - 34.0 pg   MCHC 32.0 30.0 - 36.0 g/dL   RDW 85.7 88.4 - 84.4 %   Platelets 384 150 - 400 K/uL   nRBC 0.0 0.0 - 0.2 %   Neutrophils Relative % 64 %   Neutro Abs 6.3 1.7 -  7.7 K/uL   Lymphocytes Relative 25 %   Lymphs Abs 2.4 0.7 - 4.0 K/uL   Monocytes Relative 7 %   Monocytes Absolute 0.7 0.1 - 1.0 K/uL   Eosinophils Relative 4 %   Eosinophils Absolute 0.4 0.0 - 0.5 K/uL   Basophils Relative 0 %   Basophils Absolute 0.0 0.0 - 0.1 K/uL   Immature Granulocytes 0 %   Abs Immature Granulocytes 0.03 0.00 - 0.07 K/uL    Comment: Performed at [facility], [address], [place], [place] [address]  Comprehensive metabolic panel     Status: Abnormal   Collection Time: 01/23/24  2:28 PM  Result Value Ref Range   Sodium 135 135 - 145 mmol/L   Potassium 3.0 (L) 3.5 - 5.1 mmol/L   Chloride 97 (L) 98 - 111 mmol/L   CO2 27 22 - 32 mmol/L   Glucose, Bld 126 (H) 70 - 99 mg/dL    Comment: Glucose reference range applies only to samples taken after fasting for at least 8 hours.   BUN 13 6 - 20 mg/dL   Creatinine, Ser 9.03 0.61 - 1.24 mg/dL   Calcium  9.1 8.9 - 10.3 mg/dL  Total Protein 7.4 6.5 - 8.1 g/dL   Albumin 3.2 (L) 3.5 - 5.0 g/dL   AST 24 15 - 41 U/L   ALT 22 0 - 44 U/L   Alkaline Phosphatase 89 38 - 126 U/L   Total Bilirubin 0.4 0.0 - 1.2 mg/dL   GFR, Estimated >39 >39 mL/min    Comment: (NOTE) Calculated using the CKD-EPI Creatinine Equation (2021)    Anion gap 11 5 - 15    Comment: Performed at [facility], [address], [place], [place] [address]  Glucose, capillary     Status: Abnormal   Collection Time: 01/24/24 12:25 AM  Result Value Ref Range   Glucose-Capillary 128 (H) 70 - 99 mg/dL    Comment: Glucose reference range applies only to samples taken after fasting for at least 8 hours.  Glucose, capillary     Status: Abnormal   Collection Time: 01/24/24  7:46 AM  Result Value Ref Range   Glucose-Capillary 114 (H) 70 - 99 mg/dL    Comment: Glucose reference range applies only to samples taken after fasting for at least 8 hours.    MR FOOT RIGHT W WO CONTRAST Result Date: 01/23/2024 CLINICAL DATA:  Osteonecrosis suspect of the  right foot. X-ray done. EXAM: MRI OF THE RIGHT FOREFOOT WITHOUT AND WITH CONTRAST TECHNIQUE: Multiplanar, multisequence MR imaging of the right forefoot was performed before and after the administration of intravenous contrast. CONTRAST:  10mL GADAVIST  GADOBUTROL  1 MMOL/ML IV SOLN COMPARISON:  Right foot radiographs 01/23/2024 FINDINGS: Motion artifact limits the examination. Bones/Joint/Cartilage Abnormal marrow signal intensity demonstrated throughout the proximal and distal phalanges of the fifth toe. There is loss of fat T1 signal and increased T2 signal intensity with contrast enhancement demonstrated. This is consistent with osteomyelitis. Marrow signal intensities in the first through fourth toes appears normal. Degenerative changes demonstrated in the interphalangeal joints and first metatarsal-phalangeal joint. Ligaments Grossly intact appearance of the visualized ligaments. Muscles and Tendons Visualized flexor and extensor tendons appear grossly intact. No intramuscular mass or abscess demonstrated. Soft tissues Fluid signal demonstrated throughout the dorsum of the foot consistent with soft tissue edema. No loculated collections are seen. Heterogeneous contrast enhancement demonstrated in the soft tissues around the fifth toe consistent with cellulitis. IMPRESSION: 1. Abnormal marrow signal intensity and contrast enhancement involving fifth toe consistent with osteomyelitis. 2. Soft tissue enhancement adjacent to the fifth toe likely due to cellulitis. No loculated abscess identified. 3. Diffuse subcutaneous soft tissue swelling over the dorsum of the forefoot. 4. Degenerative changes in the interphalangeal joints and first metatarsal-phalangeal joint. Electronically Signed   By: Elsie Gravely M.D.   On: 01/23/2024 21:39   DG Foot Complete Right Result Date: 01/23/2024 CLINICAL DATA:  Right foot wound.  Concern for osteomyelitis. EXAM: RIGHT FOOT COMPLETE - 3+ VIEW COMPARISON:  None Available.  FINDINGS: No acute fracture or dislocation. No evidence of acute osteolysis or erosive changes. Suspected ulcerations overlying the medial first metatarsal head and the lateral fifth toe. No definite soft tissue gas. Mild degenerative arthropathy first and fifth MTP and talonavicular joints. Os peroneum is noted. Calcaneal enthesopathy. Generalized subcutaneous edema of the right lower extremity and foot. IMPRESSION: 1. Suspected ulcerations overlying the medial first metatarsal head and the lateral fifth toe. No radiographic evidence to suggest acute osteomyelitis at this time. If there is persistent clinical concern, MRI could be obtained for further evaluation. 2. Mild degenerative arthropathy throughout the foot. 3. Generalized nonspecific subcutaneous edema of the right lower  extremity and foot. Electronically Signed   By: Harrietta Sherry M.D.   On: 01/23/2024 16:58    Review of Systems  Constitutional: Negative.   HENT: Negative.    Respiratory: Negative.    Cardiovascular: Negative.   Gastrointestinal: Negative.   Musculoskeletal:        Right foot wound  Skin:  Positive for wound.  Neurological:  Positive for numbness.  Psychiatric/Behavioral: Negative.     Blood pressure (!) 141/81, pulse 89, temperature 98 F (36.7 C), temperature source Oral, resp. rate 18, height 6' (1.829 m), weight (!) 219.1 kg, SpO2 95%. Physical Exam HENT:     Head: Atraumatic.     Mouth/Throat:     Mouth: Mucous membranes are moist.  Eyes:     Extraocular Movements: Extraocular movements intact.  Cardiovascular:     Rate and Rhythm: Normal rate.  Pulmonary:     Effort: Pulmonary effort is normal.  Abdominal:     Palpations: Abdomen is soft.  Musculoskeletal:     Cervical back: Neck supple.     Comments: Right foot demonstrates small ulceration on the lateral aspect of the fifth toe.  There is some drainage there.  Some swelling present.  No significant erythema or streaking proximally.  On the medial  side of the hallux is an abrasion but no open wound.  No significant swelling or sign of infection there.  Baseline sensory deficits.  Foot is warm and well-perfused.  Skin:    Comments: Ulceration to the lateral aspect of the right fifth toe  Neurological:     Mental Status: He is alert. Mental status is at baseline.  Psychiatric:        Mood and Affect: Mood normal.     Assessment/Plan: Right lateral fifth toe open wound that is draining and infected.  MRI evidence of bony edema within the phalanx bones of the fifth toe consistent with osteomyelitis.  No bony destruction noted on x-ray.    I had a lengthy discussion with the patient today.  He has a wound on the lateral aspect of his fifth toe with likely underlying osteomyelitis.  We discussed trying conservative treatment in the form of IV antibiotic therapy in hopes to eradicate the infection versus definitive treatment in the form of fifth toe amputation.  He is unsure of the route that he would like to take but is hoping to avoid amputation.  We did discuss the possibility of giving it a few days with the IV antibiotics to see if he has a good response.  He states he would like to talk to his family about which decision to make.  For now we will hold off on any surgery and await his decision.  We did discuss the risk of developing systemic infection in the setting of osteomyelitis and he voiced understanding.  Keep patient n.p.o. for now.  I will update the chart once he informs me of his desired course of action.  Tyler Carroll 01/24/2024, 8:22 AM

## 2024-01-24 NOTE — Assessment & Plan Note (Addendum)
 01-23-2024 admit to med/surg bed. IV ABX with rocephin /vanco. Po flagyl . Will hold lasix /hydrochlorothiazide /aldactone  due to IV vanco. Orthopedics(duda) will be consulted. Pt has good DP pulses on both feet. Will check ABI. Will get ortho to see him to get opinion on his surgical options. May need ID involvement if medical treatment with prolonged IV abx is pursued. Wound care consult to local wound dressing.

## 2024-01-24 NOTE — Progress Notes (Signed)
  Progress Note   Patient: Tyler Carroll FMW:[id number] DOB: 1975-07-29 DOA: 01/23/2024     1 DOS: the patient was seen and examined on 01/24/2024        Brief hospital course: 48 y.o. M with MO, HTN, presented with right toe infection.  MRI in the ER confirmed osteomyelitis.         Assessment and Plan: Osteomyelitis of the right fifth toe -To the OR today with Dr. Barton - Continue antibiotics - Follow ABIs  Hypertension Blood pressure elevated - Resume HCTZ, losartan  - Hold furosemide , spironolactone  for now  Class III obesity BMI 65, complicates care  Sleep apnea - Continue CPAP  Hyperlipidemia - Continue pravastatin   Prediabetes - Hold metformin   Hypokalemia Supplemented - Trend BMP      Subjective: Patient has no complaints, the toe is unchanged.  No fever overnight.  No spreading redness.     Physical Exam: BP (!) 151/72   Pulse 84   Temp 98.2 F (36.8 C) (Oral)   Resp 18   Ht 6' 0.01 (1.829 m)   Wt (!) 219.1 kg   SpO2 97%   BMI 65.49 kg/m   Adult male, lying in bed, interactive and appropriate RRR, no murmurs, no peripheral edema Respiratory rate normal, lungs clear without rales or wheezes Abdomen soft no tenderness palpation or guarding, no ascites or distention The right small toe was examined, there is no significant spreading redness or induration, and the toe appears similar to as previously pictured Attention normal, neurologically intact   Data Reviewed: Discussed with orthopedics, Dr. Elsa Basic metabolic panel shows hypokalemia CBC shows no leukocytosis MRI report reviewed, summarized above    Family Communication: None present    Disposition: Status is: Inpatient         Author: Lonni SHAUNNA Dalton, MD 01/24/2024 2:03 PM  For on call review [email or web address].

## 2024-01-24 NOTE — Anesthesia Postprocedure Evaluation (Signed)
 Anesthesia Post Note  Patient: Tyler Carroll  Procedure(s) Performed: AMPUTATION, TOE (Right: Toe)     Patient location during evaluation: Nursing Unit Anesthesia Type: MAC Level of consciousness: awake and alert Pain management: pain level controlled Vital Signs Assessment: post-procedure vital signs reviewed and stable Respiratory status: spontaneous breathing, nonlabored ventilation and respiratory function stable Cardiovascular status: stable and blood pressure returned to baseline Postop Assessment: no apparent nausea or vomiting Anesthetic complications: no   No notable events documented.  Last Vitals:  Vitals:   01/24/24 1501 01/24/24 1501  BP: 132/67   Pulse: 80 76  Resp: 18   Temp: 36.8 C   SpO2: 92% 98%    Last Pain:  Vitals:   01/24/24 1501  TempSrc: Oral  PainSc:                  Garnette FORBES Skillern

## 2024-01-24 NOTE — ED Notes (Signed)
 This nurse called [facility], they are ready to receive patient.

## 2024-01-24 NOTE — Assessment & Plan Note (Addendum)
 01-23-2024 patient is allergic to Crestor  which causes myalgias.  He states that he can take Pravachol  without problem.  Continue Pravachol  for now.

## 2024-01-24 NOTE — Assessment & Plan Note (Addendum)
 01-23-2024 chronic.  Continue his compression stockings.

## 2024-01-24 NOTE — Transfer of Care (Signed)
 Immediate Anesthesia Transfer of Care Note  Patient: Jaevon Paras  Procedure(s) Performed: AMPUTATION, TOE (Right: Toe)  Patient Location: PACU  Anesthesia Type:MAC  Level of Consciousness: awake  Airway & Oxygen Therapy: Patient Spontanous Breathing  Post-op Assessment: Report given to RN and Post -op Vital signs reviewed and stable  Post vital signs: Reviewed and stable  Last Vitals:  Vitals Value Taken Time  BP 120/63 01/24/24 14:08  Temp    Pulse 80 01/24/24 14:11  Resp 17 01/24/24 14:11  SpO2 100 % 01/24/24 14:11  Vitals shown include unfiled device data.  Last Pain:  Vitals:   01/24/24 1239  TempSrc: Oral  PainSc:          Complications: No notable events documented.

## 2024-01-24 NOTE — H&P (Addendum)
 History and Physical    Tyler Carroll FMW:[id number] DOB: [birth year] DOA: 01/23/2024  DOS: the patient was seen and examined on 01/23/2024  PCP: Sebastian Beverley NOVAK, MD   Patient coming from: Home  I have personally briefly reviewed patient's old medical records in [place] [facility]  CC: right foot wound HPI: 48 year old African-American male history of super morbid obesity, hypertension, prediabetes, bilateral lower extremity lymphedema, presents to the ER today with a 1 week history of a wound on his right foot.  Patient works in a [employer], [employer].  He was wearing too tight shoes.  He felt a blister on the right side of his foot.  This blister popped.  He has been treating his blister at home with hydroperoxide.  Has not been getting any better.  Infected gotten worse.  He went to urgent care yesterday and was told to go to the ER for evaluation.  He did not go to the ER until today.  He denies any fever or chills.  The wound on his right fifth toe has been draining some.  MRI performed of the right foot shows acute osteomyelitis of the proximal and distal phalanges of the fifth toe.  [facility] consulted.  Significant Events: Admitted 01/23/2024 for Right 5th toe osteomyelitis   Admission Labs: Na 135, K 3.0, CO2 of 27, BUN 13, Scr 0.96, glu 126 T prot 7.4, alb 3.2, AST 24, ALT 22, alk phos 89. T. Bili 0.4 WBC 9.8, HgB 12.8, plt 384  Admission Imaging Studies: XR right foot Suspected ulcerations overlying the medial first metatarsal head and the lateral fifth toe. No radiographic evidence to suggest acute osteomyelitis at this time. If there is persistent clinical concern, MRI could be obtained for further evaluation. 2. Mild degenerative arthropathy throughout the foot. 3. Generalized nonspecific subcutaneous edema of the right lower extremity and foot. MRI right foot Abnormal marrow signal intensity and contrast enhancement involving fifth toe consistent with  osteomyelitis. 2. Soft tissue enhancement adjacent to the fifth toe likely due to cellulitis. No loculated abscess identified. 3. Diffuse subcutaneous soft tissue swelling over the dorsum of the forefoot. 4. Degenerative changes in the interphalangeal joints and first metatarsal-phalangeal joint.  Significant Labs:   Significant Imaging Studies:   Antibiotic Therapy: Anti-infectives (From admission, onward)    Start     Dose/Rate Route Frequency Ordered Stop   01/23/24 1830  vancomycin  (VANCOREADY) IVPB 2000 mg/400 mL        2,000 mg 200 mL/hr over 120 Minutes Intravenous  Once 01/23/24 1827 01/23/24 2311       Procedures:   Consultants: orthopedics   ED Course: MRI right foot shows 5th toe acute osteomyelitis.  Review of Systems:  Review of Systems  Constitutional: Negative.  Negative for chills and fever.  HENT: Negative.  Negative for ear pain, hearing loss and tinnitus.   Eyes: Negative.   Respiratory: Negative.    Cardiovascular: Negative.   Gastrointestinal: Negative.   Genitourinary: Negative.   Musculoskeletal: Negative.   Skin:        Non-healing wound on right 5th toe.  Chronic lymphedema of both lower legs.  Neurological: Negative.   Endo/Heme/Allergies: Negative.   Psychiatric/Behavioral: Negative.    All other systems reviewed and are negative.   Past Medical History:  Diagnosis Date   Anemia 12/09/2022   Dental caries 01/05/2023   Diabetes mellitus without complication (HCC)    pre-diabetic 01/01/20   Frequency of urination and polyuria 01/05/2023   Hypertension  Hypokalemia 11/03/2022   Lumbago     History reviewed. No pertinent surgical history.   reports that he has never smoked. He has never been exposed to tobacco smoke. He has never used smokeless tobacco. He reports that he does not currently use alcohol. He reports that he does not use drugs.  Allergies  Allergen Reactions   Statins Other (See Comments)    Myalgia all over    Ace Inhibitors Cough   Amlodipine  Other (See Comments)    Fatigue    Family History  Problem Relation Age of Onset   Hypertension Mother    Heart failure Father     Prior to Admission medications   Medication Sig Start Date End Date Taking? Authorizing Provider  acetaminophen  (TYLENOL ) 500 MG tablet Take 1,000 mg by mouth every 6 (six) hours as needed for mild pain (pain score 1-3) or moderate pain (pain score 4-6).   Yes [provider]  Azelastine  HCl 137 MCG/SPRAY SOLN PLACE 2 SPRAYS INTO BOTH NOSTRILS 2 (TWO) TIMES DAILY. USE IN EACH NOSTRIL AS DIRECTED 11/16/23  Yes Sebastian Beverley NOVAK, MD  fluticasone  (FLONASE ) 50 MCG/ACT nasal spray Place 2 sprays into both nostrils daily. Patient taking differently: Place 1 spray into both nostrils every evening. 10/20/23  Yes Sebastian Beverley NOVAK, MD  furosemide  (LASIX ) 40 MG tablet Take 1 tablet (40 mg total) by mouth daily. Patient taking differently: Take 40 mg by mouth every evening. 10/20/23 10/14/24 Yes Sebastian Beverley NOVAK, MD  hydrochlorothiazide  (HYDRODIURIL ) 25 MG tablet Take 1 tablet (25 mg total) by mouth daily. Patient taking differently: Take 25 mg by mouth every evening. 10/20/23  Yes Sebastian Beverley NOVAK, MD  ibuprofen  (ADVIL ) 200 MG tablet Take 800 mg by mouth every 8 (eight) hours as needed for mild pain (pain score 1-3) or moderate pain (pain score 4-6).   Yes [provider]  losartan  (COZAAR ) 100 MG tablet Take 1 tablet (100 mg total) by mouth daily. Patient taking differently: Take 100 mg by mouth every evening. 10/20/23  Yes Sebastian Beverley NOVAK, MD  metFORMIN  (GLUCOPHAGE -XR) 500 MG 24 hr tablet Take 1 tablet (500 mg total) by mouth daily with breakfast. Patient taking differently: Take 500 mg by mouth daily with supper. 10/20/23 10/14/24 Yes Sebastian Beverley NOVAK, MD  Multiple Vitamins-Minerals (MULTIVITAMIN MEN 50+) TABS Take 1 tablet by mouth every evening.   Yes [provider]  Omega-3 Fatty Acids (FISH OIL) 1000 MG  CAPS Take 1,000 mg by mouth every evening.   Yes [provider]  potassium chloride  SA (KLOR-CON  M) 20 MEQ tablet Take 1 tablet (20 mEq total) by mouth 2 (two) times daily. 10/20/23  Yes Sebastian Beverley NOVAK, MD  pravastatin  (PRAVACHOL ) 10 MG tablet Take 1 tablet (10 mg total) by mouth daily. Patient taking differently: Take 10 mg by mouth every evening. 10/20/23 10/14/24 Yes Sebastian Beverley NOVAK, MD  spironolactone  (ALDACTONE ) 50 MG tablet Take 1 tablet (50 mg total) by mouth daily. Patient taking differently: Take 50 mg by mouth every evening. 10/20/23 10/14/24 Yes Sebastian Beverley NOVAK, MD  potassium chloride  (KLOR-CON ) 10 MEQ tablet Take by mouth. 12/27/18 09/03/19  [provider]  rosuvastatin  (CRESTOR ) 10 MG tablet Take 1 tablet (10 mg total) by mouth daily. 10/24/19 02/17/20  Maranda Jamee Jacob, MD    Physical Exam: Vitals:   01/23/24 1424 01/23/24 1425 01/23/24 1832  BP: (!) 179/58  (!) 154/85  Pulse: (!) 105  89  Resp: 20  14  Temp: 98.3 F (  36.8 C)  98.1 F (36.7 C)  TempSrc: Oral  Oral  SpO2: 97%  95%  Weight:  (!) 208.7 kg   Height:  6' (1.829 m)     Physical Exam Vitals and nursing note reviewed.  Constitutional:      General: He is not in acute distress.    Appearance: He is obese. He is not ill-appearing, toxic-appearing or diaphoretic.  HENT:     Head: Normocephalic and atraumatic.     Nose: Nose normal.  Cardiovascular:     Rate and Rhythm: Normal rate and regular rhythm.  Pulmonary:     Effort: Pulmonary effort is normal. No respiratory distress.     Breath sounds: Normal breath sounds.  Abdominal:     General: Abdomen is protuberant. Bowel sounds are normal. There is no distension.     Palpations: Abdomen is soft.     Tenderness: There is no abdominal tenderness.  Skin:    General: Skin is warm and dry.     Capillary Refill: Capillary refill takes less than 2 seconds.     Comments: Ulceration at base of Right 5th toe. See pictures.  Neurological:      General: No focal deficit present.     Mental Status: He is alert and oriented to person, place, and time.         Labs on Admission: I have personally reviewed following labs and imaging studies  CBC: Recent Labs  Lab 01/23/24 1428  WBC 9.8  NEUTROABS 6.3  HGB 12.8*  HCT 40.0  MCV 83.9  PLT 384   Basic Metabolic Panel: Recent Labs  Lab 01/23/24 1428  NA 135  K 3.0*  CL 97*  CO2 27  GLUCOSE 126*  BUN 13  CREATININE 0.96  CALCIUM  9.1   GFR: Estimated Creatinine Clearance: 169.3 mL/min (by C-G formula based on SCr of 0.96 mg/dL). Liver Function Tests: Recent Labs  Lab 01/23/24 1428  AST 24  ALT 22  ALKPHOS 89  BILITOT 0.4  PROT 7.4  ALBUMIN 3.2*   Radiological Exams on Admission: I have personally reviewed images MR FOOT RIGHT W WO CONTRAST Result Date: 01/23/2024 CLINICAL DATA:  Osteonecrosis suspect of the right foot. X-ray done. EXAM: MRI OF THE RIGHT FOREFOOT WITHOUT AND WITH CONTRAST TECHNIQUE: Multiplanar, multisequence MR imaging of the right forefoot was performed before and after the administration of intravenous contrast. CONTRAST:  10mL GADAVIST  GADOBUTROL  1 MMOL/ML IV SOLN COMPARISON:  Right foot radiographs 01/23/2024 FINDINGS: Motion artifact limits the examination. Bones/Joint/Cartilage Abnormal marrow signal intensity demonstrated throughout the proximal and distal phalanges of the fifth toe. There is loss of fat T1 signal and increased T2 signal intensity with contrast enhancement demonstrated. This is consistent with osteomyelitis. Marrow signal intensities in the first through fourth toes appears normal. Degenerative changes demonstrated in the interphalangeal joints and first metatarsal-phalangeal joint. Ligaments Grossly intact appearance of the visualized ligaments. Muscles and Tendons Visualized flexor and extensor tendons appear grossly intact. No intramuscular mass or abscess demonstrated. Soft tissues Fluid signal demonstrated throughout the  dorsum of the foot consistent with soft tissue edema. No loculated collections are seen. Heterogeneous contrast enhancement demonstrated in the soft tissues around the fifth toe consistent with cellulitis. IMPRESSION: 1. Abnormal marrow signal intensity and contrast enhancement involving fifth toe consistent with osteomyelitis. 2. Soft tissue enhancement adjacent to the fifth toe likely due to cellulitis. No loculated abscess identified. 3. Diffuse subcutaneous soft tissue swelling over the dorsum of the forefoot. 4. Degenerative changes in  the interphalangeal joints and first metatarsal-phalangeal joint. Electronically Signed   By: Elsie Gravely M.D.   On: 01/23/2024 21:39   DG Foot Complete Right Result Date: 01/23/2024 CLINICAL DATA:  Right foot wound.  Concern for osteomyelitis. EXAM: RIGHT FOOT COMPLETE - 3+ VIEW COMPARISON:  None Available. FINDINGS: No acute fracture or dislocation. No evidence of acute osteolysis or erosive changes. Suspected ulcerations overlying the medial first metatarsal head and the lateral fifth toe. No definite soft tissue gas. Mild degenerative arthropathy first and fifth MTP and talonavicular joints. Os peroneum is noted. Calcaneal enthesopathy. Generalized subcutaneous edema of the right lower extremity and foot. IMPRESSION: 1. Suspected ulcerations overlying the medial first metatarsal head and the lateral fifth toe. No radiographic evidence to suggest acute osteomyelitis at this time. If there is persistent clinical concern, MRI could be obtained for further evaluation. 2. Mild degenerative arthropathy throughout the foot. 3. Generalized nonspecific subcutaneous edema of the right lower extremity and foot. Electronically Signed   By: Harrietta Sherry M.D.   On: 01/23/2024 16:58   EKG: My personal interpretation of EKG shows: no EKG  Assessment/Plan Principal Problem:   Acute osteomyelitis of toe of right foot (HCC) Active Problems:   Essential hypertension   Super  obesity   Prediabetes   Hyperlipidemia   OSA (obstructive sleep apnea)   Lymphedema of both lower extremities    Assessment and Plan: * Acute osteomyelitis of toe of right foot (HCC) 01-23-2024 admit to med/surg bed. IV ABX with rocephin /vanco. Po flagyl . Will hold lasix /hydrochlorothiazide /aldactone  due to IV vanco. Orthopedics(duda) will be consulted. Pt has good DP pulses on both feet. Will check ABI. Will get ortho to see him to get opinion on his surgical options. May need ID involvement if medical treatment with prolonged IV abx is pursued. Wound care consult to local wound dressing.  Lymphedema of both lower extremities 01-23-2024 chronic.  Continue his compression stockings.  OSA (obstructive sleep apnea) 01-23-2024 Continue CPAP.  He states that his machine is broken at home.  He may need to see to help him acquire a new machine.  Hyperlipidemia 01-23-2024 patient is allergic to Crestor  which causes myalgias.  He states that he can take Pravachol  without problem.  Continue Pravachol  for now.  Prediabetes 01-23-2024 check A1c.  Super obesity Body mass index is 62.39 kg/m.   Essential hypertension 01-23-2024 given his need for IV antibiotics to treat his acute osteomyelitis, will hold his Lasix , hydrochlorothiazide  and Aldactone  for now.  Monitor his blood pressure.  Will also hold his ARB since he is going to be on concomitant vancomycin .  Switch to low-dose hydralazine  for now 10 mg 3 times daily.   DVT prophylaxis: SQ Heparin  Code Status: Full Code Family Communication: no family at bedside. He is decisional  Disposition Plan: return home  Consults called: orthopedics  Admission status: Inpatient, Med-Surg   Camellia Door, DO [facility] 01/24/2024, 12:12 AM

## 2024-01-24 NOTE — Plan of Care (Signed)
  Problem: Education: Goal: Knowledge of General Education information will improve Description: Including pain rating scale, medication(s)/side effects and non-pharmacologic comfort measures Outcome: Progressing   Problem: Health Behavior/Discharge Planning: Goal: Ability to manage health-related needs will improve Outcome: Progressing   Problem: Clinical Measurements: Goal: Ability to maintain clinical measurements within normal limits will improve Outcome: Progressing Goal: Will remain free from infection Outcome: Progressing Goal: Diagnostic test results will improve Outcome: Progressing Goal: Respiratory complications will improve Outcome: Progressing Goal: Cardiovascular complication will be avoided Outcome: Progressing   Problem: Activity: Goal: Risk for activity intolerance will decrease Outcome: Progressing   Problem: Elimination: Goal: Will not experience complications related to bowel motility Outcome: Progressing Goal: Will not experience complications related to urinary retention Outcome: Progressing   Problem: Pain Managment: Goal: General experience of comfort will improve and/or be controlled Outcome: Progressing   Problem: Safety: Goal: Ability to remain free from injury will improve Outcome: Progressing   Problem: Skin Integrity: Goal: Risk for impaired skin integrity will decrease Outcome: Progressing

## 2024-01-24 NOTE — Assessment & Plan Note (Addendum)
 01-23-2024 Continue CPAP.  He states that his machine is broken at home.  He may need to see to help him acquire a new machine.

## 2024-01-25 ENCOUNTER — Encounter (HOSPITAL_COMMUNITY): Payer: Self-pay | Admitting: Orthopaedic Surgery

## 2024-01-25 ENCOUNTER — Other Ambulatory Visit (HOSPITAL_COMMUNITY): Payer: Self-pay

## 2024-01-25 DIAGNOSIS — M86171 Other acute osteomyelitis, right ankle and foot: Secondary | ICD-10-CM | POA: Diagnosis not present

## 2024-01-25 LAB — CBC
HCT: 37.1 % — ABNORMAL LOW (ref 39.0–52.0)
Hemoglobin: 11.5 g/dL — ABNORMAL LOW (ref 13.0–17.0)
MCH: 26.4 pg (ref 26.0–34.0)
MCHC: 31 g/dL (ref 30.0–36.0)
MCV: 85.3 fL (ref 80.0–100.0)
Platelets: 325 K/uL (ref 150–400)
RBC: 4.35 MIL/uL (ref 4.22–5.81)
RDW: 14.6 % (ref 11.5–15.5)
WBC: 7.5 K/uL (ref 4.0–10.5)
nRBC: 0 % (ref 0.0–0.2)

## 2024-01-25 LAB — GLUCOSE, CAPILLARY
Glucose-Capillary: 114 mg/dL — ABNORMAL HIGH (ref 70–99)
Glucose-Capillary: 162 mg/dL — ABNORMAL HIGH (ref 70–99)
Glucose-Capillary: 120 mg/dL — ABNORMAL HIGH (ref 70–99)

## 2024-01-25 LAB — BASIC METABOLIC PANEL WITH GFR
Anion gap: 3 — ABNORMAL LOW (ref 5–15)
BUN: 10 mg/dL (ref 6–20)
CO2: 27 mmol/L (ref 22–32)
Calcium: 8.4 mg/dL — ABNORMAL LOW (ref 8.9–10.3)
Chloride: 105 mmol/L (ref 98–111)
Creatinine, Ser: 0.84 mg/dL (ref 0.61–1.24)
GFR, Estimated: >60 mL/min (ref >60)
Glucose, Bld: 110 mg/dL — ABNORMAL HIGH (ref 70–99)
Potassium: 3.8 mmol/L (ref 3.5–5.1)
Sodium: 135 mmol/L (ref 135–145)

## 2024-01-25 MED ORDER — JUVEN PO PACK
1.0000 | PACK | Freq: Two times a day (BID) | ORAL | Status: DC
  Administered 2024-01-25: 1 via ORAL
  Filled 2024-01-25: qty 1

## 2024-01-25 MED ORDER — ADULT MULTIVITAMIN W/MINERALS CH
1.0000 | ORAL_TABLET | Freq: Every day | ORAL | Status: DC
  Administered 2024-01-25: 1 via ORAL
  Filled 2024-01-25: qty 1

## 2024-01-25 MED ORDER — AMOXICILLIN-POT CLAVULANATE 875-125 MG PO TABS
1.0000 | ORAL_TABLET | Freq: Two times a day (BID) | ORAL | 0 refills | Status: DC
  Filled 2024-01-25: qty 6, 3d supply, fill #0

## 2024-01-25 MED ORDER — JUVEN PO PACK
1.0000 | PACK | Freq: Two times a day (BID) | ORAL | 0 refills | Status: AC
  Filled 2024-01-25: qty 30, 15d supply, fill #0

## 2024-01-25 MED ORDER — OXYCODONE HCL 5 MG PO TABS
5.0000 mg | ORAL_TABLET | ORAL | 0 refills | Status: AC | PRN
  Filled 2024-01-25: qty 20, 7d supply, fill #0

## 2024-01-25 NOTE — Progress Notes (Signed)
 Orthopedic Tech Progress Note Patient Details:  Tyler Carroll 01-25-1976 [id number]  Ortho Devices Type of Ortho Device: Darco shoe Ortho Device/Splint Location: RLE Ortho Device/Splint Interventions: Ordered, Other (comment)  Gave PT the Darco shoe since she was getting ready to work with patient    Post Interventions Patient Tolerated: Well Instructions Provided: Care of device  Delanna LITTIE Pac 01/25/2024, 11:45 AM

## 2024-01-25 NOTE — Progress Notes (Signed)
 Physical Therapy Treatment Patient Details Name: Tyler Carroll MRN: [id number] DOB: 1975-12-25 Today's Date: 01/25/2024   History of Present Illness Pt is a 48 y/o M admitted on 01/23/24 after presenting with c/o 1 week hx of R foot wound. 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 PMH: super morbid obesity, HTN, prediabetes, BLE lymphedema    PT Comments  Pt seen for PT tx with pt agreeable, PT providing pt with heel wedge shoe & assisted pt with donning it. Encouraged pt to wear contralateral shoe for increased balance with wedge shoe. Pt is able to ambulate with RW & mod I with good ability to weight bear through R heel, no rocking forwards on anterior aspect of R foot, no LOB. Pt reports more comfort, confidence with mobility during this session compared to first.     If plan is discharge home, recommend the following: Assistance with cooking/housework;Help with stairs or ramp for entrance   Can travel by private vehicle        Equipment Recommendations  Rolling walker (2 wheels) (bariatric)    Recommendations for Other Services       Precautions / Restrictions Precautions Precautions: None Required Braces or Orthoses:  (RLE heel wedge darco shoe) Restrictions Weight Bearing Restrictions Per Provider Order: Yes RLE Weight Bearing Per Provider Order:  (strict heel weight bearing)     Mobility  Bed Mobility Overal bed mobility: Modified Independent (supine>sit)             General bed mobility comments: not tested, pt received & left sitting in recliner    Transfers Overall transfer level: Needs assistance Equipment used: Rolling walker (2 wheels) Transfers: Sit to/from Stand Sit to Stand: Modified independent (Device/Increase time)           General transfer comment: sit>stand from recliner    Ambulation/Gait Ambulation/Gait assistance: Modified independent (Device/Increase time) Gait Distance (Feet): 60  Feet Assistive device: Rolling walker (2 wheels) Gait Pattern/deviations: Step-to pattern, Decreased step length - right, Decreased step length - left Gait velocity: decreased     General Gait Details: no LOB   Stairs Stairs:  (pt politely declined) Stairs assistance: Supervision Stair Management: Two rails, Step to pattern (R rail to simulate wall at home) Number of Stairs:  (flight) General stair comments: education re: step to pattern   Wheelchair Mobility     Tilt Bed    Modified Rankin (Stroke Patients Only)       Balance Overall balance assessment: Needs assistance   Sitting balance-Leahy Scale: Good Sitting balance - Comments: dons L sock sitting EOB, assistance to don R sock, no LOB   Standing balance support: Reliant on assistive device for balance, Bilateral upper extremity supported, During functional activity Standing balance-Leahy Scale: Good                              Communication Communication Communication: No apparent difficulties  Cognition Arousal: Alert Behavior During Therapy: WFL for tasks assessed/performed   PT - Cognitive impairments: No apparent impairments                       PT - Cognition Comments: slightly anxious re: situation, asking appropriate questions Following commands: Intact      Cueing Cueing Techniques: Verbal cues  Exercises      General Comments General comments (skin integrity, edema, etc.): Encouraged pt to perform foot inspections  daily to ensure it's healing & not worsening.      Pertinent Vitals/Pain Pain Assessment Pain Assessment: No/denies pain Faces Pain Scale: Hurts little more Pain Location: RLE Pain Descriptors / Indicators: Discomfort Pain Intervention(s): Monitored during session, Patient requesting pain meds-RN notified    Home Living Family/patient expects to be discharged to:: Private residence Living Arrangements: Spouse/significant other Available Help at  Discharge: Family;Available PRN/intermittently (wife works outside of the home duirng the day) Type of Home: Apartment Home Access: Stairs to enter Entrance Stairs-Rails: Left (wall on R) Entrance Stairs-Number of Steps: flight   Home Layout: One level Home Equipment: None      Prior Function            PT Goals (current goals can now be found in the care plan section) Acute Rehab PT Goals Patient Stated Goal: foot to get better PT Goal Formulation: With patient Time For Goal Achievement: 02/08/24 Potential to Achieve Goals: Good Progress towards PT goals: Progressing toward goals    Frequency    Min 1X/week      PT Plan      Co-evaluation              AM-PAC PT 6 Clicks Mobility   Outcome Measure  Help needed turning from your back to your side while in a flat bed without using bedrails?: None Help needed moving from lying on your back to sitting on the side of a flat bed without using bedrails?: A Little Help needed moving to and from a bed to a chair (including a wheelchair)?: None Help needed standing up from a chair using your arms (e.g., wheelchair or bedside chair)?: None Help needed to walk in hospital room?: None Help needed climbing 3-5 steps with a railing? : A Little 6 Click Score: 22    End of Session   Activity Tolerance: Patient tolerated treatment well Patient left: in chair;with call [place]/phone within reach Nurse Communication: Mobility status PT Visit Diagnosis: Muscle weakness (generalized) (M62.81);Other abnormalities of gait and mobility (R26.89)     Time: 8845-8794 PT Time Calculation (min) (ACUTE ONLY): 11 min  Charges:    $Therapeutic Activity: 8-22 mins PT General Charges $$ ACUTE PT VISIT: 1 Visit                     Richerd Pinal, PT, DPT 01/25/24, 12:17 PM   Richerd CHRISTELLA Pinal 01/25/2024, 12:16 PM

## 2024-01-25 NOTE — TOC CM/SW Note (Signed)
 Transition of Care [facility]) - Inpatient Brief Assessment   Patient Details  Name: Tyler Carroll MRN: [id number] Date of Birth: 1976/05/23  Transition of [facility]) CM/SW Contact:    Sudie Erminio Deems, RN Phone Number: 01/25/2024, 11:27 AM   Clinical Narrative: Case Manager spoke with patient and he has no agency preference for DME. Patient is agreeable for Case Manager to use [employer] for bariatric rolling walker. DME to be delivered to the room. No further needs identified at this time.    Transition of Care Asessment: Insurance and Status: Insurance coverage has been reviewed Patient has primary care physician: Yes Home environment has been reviewed: Private Residence Prior level of function:: N/A Prior/Current Home Services: No current home services Social Drivers of Health Review: SDOH reviewed no interventions necessary Readmission risk has been reviewed: Yes (Currently Green 7%) Transition of care needs: transition of care needs identified, TOC will continue to follow

## 2024-01-25 NOTE — Discharge Instructions (Signed)
 Lillia Mountain, MD [facility]  Please read the following information regarding your care after surgery.  Medications  You only need a prescription for the narcotic pain medicine (ex. oxycodone , Percocet, Norco).  All of the other medicines listed below are available over the counter. ? acetaminophen  (Tylenol ) 500 mg every 4-6 hours as you need for minor to moderate pain   Weight Bearing ? Once your nerve block is completely worn off, OK to HEEL weightbear in postop shoe on the operated leg or foot. This means do NOT touch the front of your surgical leg to the ground!  Cast / Splint / Dressing ? If you have a dressing, do NOT remove this. Keep your splint, cast or dressing clean and dry.  Don't put anything (coat hanger, pencil, etc) down inside of it.  If it gets wet, call the office immediately to schedule an appointment for a cast change.  Swelling IMPORTANT: It is normal for you to have swelling where you had surgery. To reduce swelling and pain, keep at least 3 pillows under your leg so that your toes are above your nose and your heel is above the level of your hip.  It may be necessary to keep your foot or leg elevated for several weeks.  This is critical to helping your incisions heal and your pain to feel better.  Follow Up Call my office at [phone] when you are discharged from the hospital or surgery center to schedule an appointment to be seen 7-10 days after surgery.  Call my office at [phone] if you develop a fever >101.5 F, nausea, vomiting, bleeding from the surgical site or severe pain.

## 2024-01-25 NOTE — Progress Notes (Signed)
 Initial Nutrition Assessment  DOCUMENTATION CODES:   Morbid obesity  INTERVENTION:   - 1 packet Juven po BID to support wound healing, each packet provides 95 calories, 2.5 grams of protein, and 9.8 grams of carbohydrate  - MVI with minerals daily  - Provided education regarding impact of nutrition on wound healing  NUTRITION DIAGNOSIS:   Increased nutrient needs related to wound healing as evidenced by estimated needs.  GOAL:   Patient will meet greater than or equal to 90% of their needs  MONITOR:   PO intake, Supplement acceptance, Labs, Weight trends, Skin  REASON FOR ASSESSMENT:   Consult Wound healing  ASSESSMENT:   48 year old male who presented to the ED on 01/23/24 with wounds to right foot. PMH of HTN, HLD, OSA, prediabetes, BLE lymphedema. Pt admitted with acute osteomyelitis.  8/24 - s/p right foot 5th toe partial amputation  RD consulted for wound healing. Spoke with pt at bedside who reports good appetite and states that he has been eating well this admission. Pt denies nausea, vomiting, or other nutrition-related issues at this time.  Pt states that he has a good appetite at baseline and eats 3 meals daily. A typical breakfast may include cereal, a breakfast sandwich, or a Pop-tart. A typical lunch includes something from the cafeteria at work (protein with sides). A typical dinner may include baked chicken with sides. Pt states that he does love sweets and regular ginger ale.  RD discussed importance of proper nutrition in wound healing. Discussed need to prioritize high-protein foods as well as limit simple carbohydrates to keep blood sugar under control. Also discussed importance of taking a daily multivitamin. Pt willing to try Juven oral nutrition supplements during current admission. Discussed options for high-protein supplementation after discharge. Pt reports that he has protein powder at home; brainstormed ways for pt to incorporate a protein shake into  his diet.  Medications reviewed and include: SSI, flagyl   Labs reviewed: hemoglobin A1C 6.2 CBG's: 114-162 x 24 hours  NUTRITION - FOCUSED PHYSICAL EXAM:  Flowsheet Row Most Recent Value  Orbital Region No depletion  Upper Arm Region No depletion  Thoracic and Lumbar Region No depletion  Buccal Region No depletion  Temple Region No depletion  Clavicle Bone Region No depletion  Clavicle and Acromion Bone Region No depletion  Scapular Bone Region No depletion  Dorsal Hand No depletion  Patellar Region No depletion  Anterior Thigh Region No depletion  Posterior Calf Region No depletion  Edema (RD Assessment) Mild  [BLE]  Hair Reviewed  Eyes Reviewed  Mouth Reviewed  Skin Reviewed  Nails Reviewed    Diet Order:   Diet Order             Diet regular Room service appropriate? Yes; Fluid consistency: Thin  Diet effective now                   EDUCATION NEEDS:   Education needs have been addressed  Skin:  Skin Assessment: Skin Integrity Issues: Diabetic Ulcer: right foot  Last BM:  01/23/24  Height:   Ht Readings from Last 1 Encounters:  01/24/24 6' 0.01 (1.829 m)    Weight:   Wt Readings from Last 1 Encounters:  01/24/24 (!) 219.1 kg    BMI:  Body mass index is 65.49 kg/m.  Estimated Nutritional Needs:   Kcal:  2400-2600  Protein:  120-140 grams  Fluid:  >2.2 L    Mallie Satchel, MS, RD, LDN Registered Dietitian II Please see Tracey  for contact information.

## 2024-01-25 NOTE — Progress Notes (Signed)
 Hydralazine  held due to blood pressure 123/68 and patient has CPAP going at this time.

## 2024-01-25 NOTE — Evaluation (Signed)
 Physical Therapy Evaluation Patient Details Name: Tyler Carroll MRN: [id number] DOB: 03/05/1976 Today's Date: 01/25/2024  History of Present Illness  Pt is a 48 y/o M admitted on 01/23/24 after presenting with c/o 1 week hx of R foot wound. 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 PMH: super morbid obesity, HTN, prediabetes, BLE lymphedema  Clinical Impression  Pt seen for PT evaluation with pt agreeable. Pt reports prior to admission he was living in a 2nd level apartment with flight of stairs with L rail, R wall to access, ambulatory without AD, driving a forklift for work. On this date, PT educates pt on heel weight bearing & need to minimize activity. Pt is able to ambulate in hallway with RW & mod I with good ability to weight bear through heel, step to pattern. Pt negotiated flight of stairs with supervision with good return demo of compensatory pattern. Pt asking appropriate questions re: recovery with PT educating them to pt satisfaction. Pt anticipating d/c home today.      If plan is discharge home, recommend the following: Assistance with cooking/housework;Help with stairs or ramp for entrance   Can travel by private vehicle        Equipment Recommendations Rolling walker (2 wheels) (bariatric)  Recommendations for Other Services       Functional Status Assessment Patient has had a recent decline in their functional status and demonstrates the ability to make significant improvements in function in a reasonable and predictable amount of time.     Precautions / Restrictions Precautions Precautions: None Restrictions Weight Bearing Restrictions Per Provider Order: Yes RLE Weight Bearing Per Provider Order:  (strict heel weight bearing)      Mobility  Bed Mobility Overal bed mobility: Modified Independent (supine>sit)                  Transfers Overall transfer level: Needs assistance Equipment used: Rolling walker  (2 wheels) Transfers: Sit to/from Stand Sit to Stand: Supervision           General transfer comment: educational cuing re: hand placement    Ambulation/Gait Ambulation/Gait assistance: Modified independent (Device/Increase time) Gait Distance (Feet): 40 Feet (+ 40 ft) Assistive device: Rolling walker (2 wheels) Gait Pattern/deviations: Step-to pattern, Decreased step length - right, Decreased step length - left Gait velocity: decreased     General Gait Details: Room to [facility] stairwell & back, does well at maintaining heel weight bearing RLE, uses BUE on RW to offload RLE during weight bearing.  Stairs Stairs: Yes Stairs assistance: Supervision Stair Management: Two rails, Step to pattern (R rail to simulate wall at home) Number of Stairs:  (flight) General stair comments: education re: step to pattern  Wheelchair Mobility     Tilt Bed    Modified Rankin (Stroke Patients Only)       Balance Overall balance assessment: Needs assistance   Sitting balance-Leahy Scale: Good Sitting balance - Comments: dons L sock sitting EOB, assistance to don R sock, no LOB   Standing balance support: Reliant on assistive device for balance, Bilateral upper extremity supported, During functional activity Standing balance-Leahy Scale: Good                               Pertinent Vitals/Pain Pain Assessment Pain Assessment: Faces Faces Pain Scale: Hurts little more Pain Location: RLE Pain Descriptors / Indicators: Discomfort Pain Intervention(s): Monitored during session, Patient requesting  pain meds-RN notified    Home Living Family/patient expects to be discharged to:: Private residence Living Arrangements: Spouse/significant other Available Help at Discharge: Family;Available PRN/intermittently (wife works outside of the home duirng the day) Type of Home: Apartment Home Access: Stairs to enter Entrance Stairs-Rails: Left (wall on R) Entrance Stairs-Number of  Steps: flight   Home Layout: One level Home Equipment: None      Prior Function Prior Level of Function : Independent/Modified Independent;Working/employed;Driving             Mobility Comments: ambulatory without AD, works as Museum/gallery exhibitions officer ADLs Comments: independent     Extremity/Trunk Assessment   Upper Extremity Assessment Upper Extremity Assessment: Overall WFL for tasks assessed    Lower Extremity Assessment Lower Extremity Assessment: Overall WFL for tasks assessed;RLE deficits/detail (BLE lymphedema (R>L)) RLE Deficits / Details: R foot ace wrapped    Cervical / Trunk Assessment Cervical / Trunk Assessment: Normal  Communication   Communication Communication: No apparent difficulties    Cognition Arousal: Alert Behavior During Therapy: WFL for tasks assessed/performed   PT - Cognitive impairments: No apparent impairments                       PT - Cognition Comments: slightly anxious re: situation, asking appropriate questions Following commands: Intact       Cueing Cueing Techniques: Verbal cues     General Comments General comments (skin integrity, edema, etc.): Encouraged pt to perform foot inspections daily to ensure it's healing & not worsening.    Exercises     Assessment/Plan    PT Assessment Patient needs continued PT services  PT Problem List Decreased strength;Decreased mobility;Decreased knowledge of precautions;Decreased knowledge of use of DME;Cardiopulmonary status limiting activity;Decreased skin integrity;Obesity;Decreased balance;Decreased activity tolerance       PT Treatment Interventions DME instruction;Balance training;Gait training;Neuromuscular re-education;Stair training;Functional mobility training;Therapeutic activities;Therapeutic exercise;Patient/family education    PT Goals (Current goals can be found in the Care Plan section)  Acute Rehab PT Goals Patient Stated Goal: foot to get better PT Goal  Formulation: With patient Time For Goal Achievement: 02/08/24 Potential to Achieve Goals: Good    Frequency Min 1X/week     Co-evaluation               AM-PAC PT 6 Clicks Mobility  Outcome Measure Help needed turning from your back to your side while in a flat bed without using bedrails?: None Help needed moving from lying on your back to sitting on the side of a flat bed without using bedrails?: A Little Help needed moving to and from a bed to a chair (including a wheelchair)?: None Help needed standing up from a chair using your arms (e.g., wheelchair or bedside chair)?: A Little Help needed to walk in hospital room?: None Help needed climbing 3-5 steps with a railing? : A Little 6 Click Score: 21    End of Session   Activity Tolerance: Patient tolerated treatment well Patient left: in chair;with call Gautier/phone within reach Nurse Communication: Mobility status PT Visit Diagnosis: Muscle weakness (generalized) (M62.81)    Time: 8971-8898 PT Time Calculation (min) (ACUTE ONLY): 33 min   Charges:   PT Evaluation $PT Eval Low Complexity: 1 Low PT Treatments $Therapeutic Activity: 8-22 mins PT General Charges $$ ACUTE PT VISIT: 1 Visit         Richerd Pinal, PT, DPT 01/25/24, 11:14 AM   Richerd CHRISTELLA Pinal 01/25/2024, 11:13 AM

## 2024-01-25 NOTE — Progress Notes (Signed)
 Ok to stop ceftriaxone  post amputation per Dr. Jonel.  Sergio Batch, PharmD, BCIDP, AAHIVP, CPP Infectious Disease Pharmacist 01/25/2024 12:53 PM

## 2024-01-25 NOTE — Progress Notes (Signed)
 DC order noted per MD. DC RN at bedside to assist primary RN. 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 BP improved after primary RN administered BP meds. AVS printed/reviewed. TOC meds delivered to the patient. Pending PIV removal/DME delivery. Primary RN informed.

## 2024-01-25 NOTE — Plan of Care (Addendum)
 Pt doing well overnight.  Sleeping with Cpap restfully. Pt stated pain was a 5/10 but only wanted tylenol . Provided. Then awoke at 3am and asked for Oxy. Provided. No other issues. Dressing dry and intact. Vitals stable.  Problem: Education: Goal: Knowledge of General Education information will improve Description: Including pain rating scale, medication(s)/side effects and non-pharmacologic comfort measures Outcome: Progressing   Problem: Clinical Measurements: Goal: Ability to maintain clinical measurements within normal limits will improve Outcome: Progressing Goal: Will remain free from infection Outcome: Progressing Goal: Diagnostic test results will improve Outcome: Progressing Goal: Respiratory complications will improve Outcome: Progressing Goal: Cardiovascular complication will be avoided Outcome: Progressing

## 2024-01-25 NOTE — TOC CM/SW Note (Signed)
 Transition of Care [facility]) - Inpatient Brief Assessment   Patient Details  Name: Jacson Rapaport MRN: [id number] Date of Birth: 1975-08-30  [facility] of [facility]) CM/SW Contact:    Lauraine FORBES Saa, LCSWA Phone Number: 01/25/2024, 9:07 AM   Clinical Narrative:  9:07 AM Per chart review, patient resides at home with spouse/significant other. Patient has a PCP and insurance. Patient does not have SNF/HH/DME history. Patient's preferred pharmacy is [facility] [address]. TOC consult was placed for medication assistance and HH/DME needs. TOC will continue to follow and be available to assist.  Transition of Care Asessment: Insurance and Status: Insurance coverage has been reviewed Patient has primary care physician: Yes Home environment has been reviewed: Private Residence Prior level of function:: N/A Prior/Current Home Services: No current home services Social Drivers of Health Review: SDOH reviewed no interventions necessary Readmission risk has been reviewed: Yes (Currently Green 7%) Transition of care needs: transition of care needs identified, TOC will continue to follow

## 2024-01-25 NOTE — Discharge Summary (Signed)
 Physician Discharge Summary   Patient: Tyler Carroll MRN: [id number] DOB: Jun 02, 1976  Admit date:     01/23/2024  Discharge date: 01/25/24  Discharge Physician: Lonni SHAUNNA Dalton   PCP: Sebastian Beverley NOVAK, MD     Recommendations at discharge:  Follow up with Orthopedics Dr. Barton for right 5th toe amputation Follow up with PCP Dr. Sebastian for hypertension Dr. Sebastian: Please follow-up broken CPAP Follow-up blood pressure and titrate antihypertensives as appropriate Please obtain outpatient ABI     Discharge Diagnoses: Principal Problem:   Acute osteomyelitis of toe of right foot (HCC) Active Problems:   Essential hypertension Class III obesity   Prediabetes   Hyperlipidemia   OSA (obstructive sleep apnea)   Lymphedema of both lower extremities Hypokalemia     Hospital Course: 48 y.o. M with MO, HTN, presented with right toe infection.  MRI in the ER confirmed osteomyelitis.       * Acute osteomyelitis of toe of right foot (HCC) The patient was admitted and started on broad-spectrum antibiotics.  He was taken to the OR on 8/24 by Dr. Carline Lot.  Postoperative course was uncomplicated, margins were clean, he was provided with wound care instructions by orthopedics.  Worked with physical therapy and was able to transfer and mobilize adequately.  Orthopedics arranged for outpatient follow-up.    OSA (obstructive sleep apnea) Reported that his home CPAP was broken.   Essential hypertension Patient's HCTZ and spironolactone  were held while he was being treated with vancomycin .  These were resumed at discharge.  He did have some high range blood pressures while in the hospital.  Recommend close PCP follow-up            The [place]  Controlled Substances Registry was reviewed for this patient prior to discharge.   Consultants: Orthopedics Procedures performed: 5th toe amputation  Disposition: Home Diet recommendation:  Cardiac  diet  DISCHARGE MEDICATION: Allergies as of 01/25/2024       Reactions   Statins Other (See Comments)   Myalgia all over   Ace Inhibitors Cough   Amlodipine  Other (See Comments)   Fatigue        Medication List     TAKE these medications    acetaminophen  500 MG tablet Commonly known as: TYLENOL  Take 1,000 mg by mouth every 6 (six) hours as needed for mild pain (pain score 1-3) or moderate pain (pain score 4-6).   amoxicillin -clavulanate 875-125 MG tablet Commonly known as: AUGMENTIN  Take 1 tablet by mouth 2 (two) times daily.   Azelastine  HCl 137 MCG/SPRAY Soln PLACE 2 SPRAYS INTO BOTH NOSTRILS 2 (TWO) TIMES DAILY. USE IN EACH NOSTRIL AS DIRECTED   Fish Oil 1000 MG Caps Take 1,000 mg by mouth every evening.   fluticasone  50 MCG/ACT nasal spray Commonly known as: FLONASE  Place 2 sprays into both nostrils daily. What changed:  how much to take when to take this   furosemide  40 MG tablet Commonly known as: LASIX  Take 1 tablet (40 mg total) by mouth daily. What changed: when to take this   hydrochlorothiazide  25 MG tablet Commonly known as: HYDRODIURIL  Take 1 tablet (25 mg total) by mouth daily. What changed: when to take this   ibuprofen  200 MG tablet Commonly known as: ADVIL  Take 800 mg by mouth every 8 (eight) hours as needed for mild pain (pain score 1-3) or moderate pain (pain score 4-6).   losartan  100 MG tablet Commonly known as: COZAAR  Take 1 tablet (100 mg total) by mouth daily.  What changed: when to take this   metFORMIN  500 MG 24 hr tablet Commonly known as: GLUCOPHAGE -XR Take 1 tablet (500 mg total) by mouth daily with breakfast. What changed: when to take this   Multivitamin Men 50+ Tabs Take 1 tablet by mouth every evening.   nutrition supplement (JUVEN) Pack Take 1 packet by mouth 2 (two) times daily between meals.   oxyCODONE  5 MG immediate release tablet Commonly known as: Roxicodone  Take 1 tablet (5 mg total) by mouth every 4  (four) hours as needed for up to 7 days.   potassium chloride  SA 20 MEQ tablet Commonly known as: KLOR-CON  M Take 1 tablet (20 mEq total) by mouth 2 (two) times daily.   pravastatin  10 MG tablet Commonly known as: PRAVACHOL  Take 1 tablet (10 mg total) by mouth daily. What changed: when to take this   spironolactone  50 MG tablet Commonly known as: Aldactone  Take 1 tablet (50 mg total) by mouth daily. What changed: when to take this               Durable Medical Equipment  (From admission, onward)           Start     Ordered   01/25/24 1125  For home use only DME Walker rolling  Once       Comments: Bariatric rolling walker  Question Answer Comment  Walker: With 5 Inch Wheels   Patient needs a walker to treat with the following condition General weakness      01/25/24 1125              Discharge Care Instructions  (From admission, onward)           Start     Ordered   01/25/24 0000  Discharge wound care:       Comments: See separate instructions from Dr. Barton   01/25/24 1518            Follow-up Information     [facility] Follow up.   Why: Bariatric rolling walker to be delivered to the room Contact information: Address: [address], [place], [place] [address] Phone: [phone]        Barton Drape, MD Follow up.   Specialty: Orthopedic Surgery Contact information: [address] [place] [address] [phone]         Sebastian Beverley NOVAK, MD Follow up.   Specialty: Family Medicine Contact information: [address] [place] [address] [phone]                 Discharge Instructions     Discharge instructions   Complete by: As directed    **IMPORTANT DISCHARGE INSTRUCTIONS**   From Dr. Jonel: You were admitted for infected toe. Here, we found the infection was located in the bone. This was treated with amputation. You should take three more  days of antibiotics with Augmentin  875-125 mg twice daily starting tonight Resume all of your other home medicines  Go see your primary doctor in 1 week  Follow up with Dr. Barton as directed  Apply no ointments or creams to the area. Dress the wound as taught by Dr. Barton and nursing    If you have fever, increasing pain, increasing drainage, call Dr. [place] office right away.   Discharge wound care:   Complete by: As directed    See separate instructions from Dr. Barton   Increase activity slowly   Complete by: As directed  Discharge Exam: Filed Weights   01/23/24 1425 01/24/24 0022 01/24/24 1232  Weight: (!) 208.7 kg (!) 219.1 kg (!) 219.1 kg    General: Pt is alert, awake, not in acute distress Cardiovascular: RRR, nl S1-S2, no murmurs appreciated.   No LE edema.   Respiratory: Normal respiratory rate and rhythm.  CTAB without rales or wheezes. Abdominal: Abdomen soft and non-tender.  No distension or HSM.   Neuro/Psych: Strength symmetric in upper and lower extremities.  Judgment and insight appear normal.   Condition at discharge: good  The results of significant diagnostics from this hospitalization (including imaging, microbiology, ancillary and laboratory) are listed below for reference.   Imaging Studies: MR FOOT RIGHT W WO CONTRAST Result Date: 01/23/2024 CLINICAL DATA:  Osteonecrosis suspect of the right foot. X-ray done. EXAM: MRI OF THE RIGHT FOREFOOT WITHOUT AND WITH CONTRAST TECHNIQUE: Multiplanar, multisequence MR imaging of the right forefoot was performed before and after the administration of intravenous contrast. CONTRAST:  10mL GADAVIST  GADOBUTROL  1 MMOL/ML IV SOLN COMPARISON:  Right foot radiographs 01/23/2024 FINDINGS: Motion artifact limits the examination. Bones/Joint/Cartilage Abnormal marrow signal intensity demonstrated throughout the proximal and distal phalanges of the fifth toe. There is loss of fat T1 signal and  increased T2 signal intensity with contrast enhancement demonstrated. This is consistent with osteomyelitis. Marrow signal intensities in the first through fourth toes appears normal. Degenerative changes demonstrated in the interphalangeal joints and first metatarsal-phalangeal joint. Ligaments Grossly intact appearance of the visualized ligaments. Muscles and Tendons Visualized flexor and extensor tendons appear grossly intact. No intramuscular mass or abscess demonstrated. Soft tissues Fluid signal demonstrated throughout the dorsum of the foot consistent with soft tissue edema. No loculated collections are seen. Heterogeneous contrast enhancement demonstrated in the soft tissues around the fifth toe consistent with cellulitis. IMPRESSION: 1. Abnormal marrow signal intensity and contrast enhancement involving fifth toe consistent with osteomyelitis. 2. Soft tissue enhancement adjacent to the fifth toe likely due to cellulitis. No loculated abscess identified. 3. Diffuse subcutaneous soft tissue swelling over the dorsum of the forefoot. 4. Degenerative changes in the interphalangeal joints and first metatarsal-phalangeal joint. Electronically Signed   By: [place] Gravely M.D.   On: 01/23/2024 21:39   DG Foot Complete Right Result Date: 01/23/2024 CLINICAL DATA:  Right foot wound.  Concern for osteomyelitis. EXAM: RIGHT FOOT COMPLETE - 3+ VIEW COMPARISON:  None Available. FINDINGS: No acute fracture or dislocation. No evidence of acute osteolysis or erosive changes. Suspected ulcerations overlying the medial first metatarsal head and the lateral fifth toe. No definite soft tissue gas. Mild degenerative arthropathy first and fifth MTP and talonavicular joints. Os peroneum is noted. Calcaneal enthesopathy. Generalized subcutaneous edema of the right lower extremity and foot. IMPRESSION: 1. Suspected ulcerations overlying the medial first metatarsal head and the lateral fifth toe. No radiographic evidence to  suggest acute osteomyelitis at this time. If there is persistent clinical concern, MRI could be obtained for further evaluation. 2. Mild degenerative arthropathy throughout the foot. 3. Generalized nonspecific subcutaneous edema of the right lower extremity and foot. Electronically Signed   By: Harrietta Sherry M.D.   On: 01/23/2024 16:58    Microbiology: Results for orders placed or performed during the hospital encounter of 01/23/24  Surgical pcr screen     Status: Abnormal   Collection Time: 01/24/24 10:28 AM   Specimen: Nasal Mucosa; Nasal Swab  Result Value Ref Range Status   MRSA, PCR POSITIVE (A) NEGATIVE Final    Comment: RESULT CALLED TO, READ BACK  BY AND VERIFIED WITH: RN LUKE MEISSNER [id number] AT 1219 BY EC    Staphylococcus aureus POSITIVE (A) NEGATIVE Final    Comment: (NOTE) The Xpert SA Assay (FDA approved for NASAL specimens in patients 48 years of age and older), is one component of a comprehensive surveillance program. It is not intended to diagnose infection nor to guide or monitor treatment. Performed at [facility], [address], [place], [place] [address]   Aerobic/Anaerobic Culture w Gram Stain (surgical/deep wound)     Status: None (Preliminary result)   Collection Time: 01/24/24  1:39 PM   Specimen: Wound; Tissue  Result Value Ref Range Status   Specimen Description WOUND  Final   Special Requests RIGHT FIFTH TOE AMPUTATION  Final   Gram Stain   Final    RARE WBC PRESENT, PREDOMINANTLY PMN FEW GRAM POSITIVE COCCI    Culture   Final    MODERATE STAPHYLOCOCCUS AUREUS SUSCEPTIBILITIES TO FOLLOW Performed at [facility], [address], [place], [place] [address]    Report Status PENDING  Incomplete  Aerobic/Anaerobic Culture w Gram Stain (surgical/deep wound)     Status: None (Preliminary result)   Collection Time: 01/24/24  1:51 PM   Specimen: PATH Digit amputation; Tissue  Result Value Ref Range Status   Specimen Description TISSUE   Final   Special Requests RIGHT FIFTH TOE AMPUTATION,PATH DIGIT AMPUTATION  Final   Gram Stain   Final    RARE WBC PRESENT, PREDOMINANTLY PMN RARE GRAM POSITIVE COCCI    Culture   Final    FEW STAPHYLOCOCCUS AUREUS CULTURE REINCUBATED FOR BETTER GROWTH RARE KLEBSIELLA AEROGENES SUSCEPTIBILITIES TO FOLLOW Performed at [facility], [address] [place], [place] [address]    Report Status PENDING  Incomplete    Labs: CBC: Recent Labs  Lab 01/23/24 1428 01/24/24 0847 01/25/24 0525  WBC 9.8 7.9 7.5  NEUTROABS 6.3 4.7  --   HGB 12.8* 11.8* 11.5*  HCT 40.0 36.5* 37.1*  MCV 83.9 81.7 85.3  PLT 384 333 325   Basic Metabolic Panel: Recent Labs  Lab 01/23/24 1428 01/24/24 2127 01/25/24 0525  NA 135 137 135  K 3.0* 3.6 3.8  CL 97* 101 105  CO2 27 26 27   GLUCOSE 126* 134* 110*  BUN 13 9 10   CREATININE 0.96 0.86 0.84  CALCIUM  9.1 8.5* 8.4*   Liver Function Tests: Recent Labs  Lab 01/23/24 1428  AST 24  ALT 22  ALKPHOS 89  BILITOT 0.4  PROT 7.4  ALBUMIN 3.2*   CBG: Recent Labs  Lab 01/24/24 1757 01/24/24 2057 01/25/24 0751 01/25/24 1116 01/25/24 1632  GLUCAP 143* 162* 114* 162* 120*    Discharge time spent: approximately 45 minutes spent on discharge counseling, evaluation of patient on day of discharge, and coordination of discharge planning with nursing, social work, pharmacy and case management  Signed: Lonni SHAUNNA Dalton, MD [facility] 01/25/2024

## 2024-01-26 ENCOUNTER — Telehealth: Payer: Self-pay

## 2024-01-26 NOTE — Transitions of Care (Post Inpatient/ED Visit) (Unsigned)
   01/26/2024  Name: Tyler Carroll MRN: [id number] DOB: Sep 29, 1975  Today's TOC FU Call Status: Today's TOC FU Call Status:: Unsuccessful Call (1st Attempt) Unsuccessful Call (1st Attempt) Date: 01/26/24  Attempted to reach the patient regarding the most recent Inpatient/ED visit.  Follow Up Plan: Additional outreach attempts will be made to reach the patient to complete the Transitions of Care (Post Inpatient/ED visit) call.   Signature Julian Lemmings, LPN [facility] Nurse Health Advisor Direct Dial [phone]

## 2024-01-27 NOTE — Transitions of Care (Post Inpatient/ED Visit) (Signed)
   01/27/2024  Name: Tyler Carroll MRN: [id number] DOB: 02/14/1976  Today's TOC FU Call Status: Today's TOC FU Call Status:: Unsuccessful Call (1st Attempt) Unsuccessful Call (1st Attempt) Date: 01/26/24  Attempted to reach the patient regarding the most recent Inpatient/ED visit.  Follow Up Plan: No further outreach attempts will be made at this time. We have been unable to contact the patient. Patient already seen in office Signature  Julian Lemmings, LPN [facility] Nurse Health Advisor Direct Dial [phone]

## 2024-01-28 ENCOUNTER — Ambulatory Visit: Payer: Self-pay | Admitting: Family Medicine

## 2024-01-29 LAB — AEROBIC/ANAEROBIC CULTURE W GRAM STAIN (SURGICAL/DEEP WOUND)

## 2024-02-03 ENCOUNTER — Encounter: Payer: Self-pay | Admitting: Family Medicine

## 2024-02-03 ENCOUNTER — Ambulatory Visit: Admitting: Family Medicine

## 2024-02-03 VITALS — BP 140/92 | HR 98 | Temp 96.8°F | Resp 18 | Wt >= 6400 oz

## 2024-02-03 DIAGNOSIS — M86171 Other acute osteomyelitis, right ankle and foot: Secondary | ICD-10-CM

## 2024-02-03 DIAGNOSIS — I10 Essential (primary) hypertension: Secondary | ICD-10-CM

## 2024-02-03 DIAGNOSIS — E782 Mixed hyperlipidemia: Secondary | ICD-10-CM | POA: Diagnosis not present

## 2024-02-03 DIAGNOSIS — S98131A Complete traumatic amputation of one right lesser toe, initial encounter: Secondary | ICD-10-CM | POA: Insufficient documentation

## 2024-02-03 DIAGNOSIS — R7303 Prediabetes: Secondary | ICD-10-CM

## 2024-02-03 DIAGNOSIS — G4733 Obstructive sleep apnea (adult) (pediatric): Secondary | ICD-10-CM

## 2024-02-03 LAB — COMPREHENSIVE METABOLIC PANEL WITH GFR
ALT: 23 U/L (ref 0–53)
AST: 17 U/L (ref 0–37)
Albumin: 3.9 g/dL (ref 3.5–5.2)
Alkaline Phosphatase: 87 U/L (ref 39–117)
BUN: 16 mg/dL (ref 6–23)
CO2: 29 meq/L (ref 19–32)
Calcium: 8.9 mg/dL (ref 8.4–10.5)
Chloride: 97 meq/L (ref 96–112)
Creatinine, Ser: 0.8 mg/dL (ref 0.40–1.50)
GFR: 103.43 mL/min (ref >60.00)
Glucose, Bld: 107 mg/dL — ABNORMAL HIGH (ref 70–99)
Potassium: 3.5 meq/L (ref 3.5–5.1)
Sodium: 136 meq/L (ref 135–145)
Total Bilirubin: 0.4 mg/dL (ref 0.2–1.2)
Total Protein: 7.9 g/dL (ref 6.0–8.3)

## 2024-02-03 LAB — CBC WITH DIFFERENTIAL/PLATELET
Basophils Absolute: 0.1 K/uL (ref 0.0–0.1)
Basophils Relative: 0.7 % (ref 0.0–3.0)
Eosinophils Absolute: 0.4 K/uL (ref 0.0–0.7)
Eosinophils Relative: 3.3 % (ref 0.0–5.0)
HCT: 40.8 % (ref 39.0–52.0)
Hemoglobin: 13.5 g/dL (ref 13.0–17.0)
Lymphocytes Relative: 24.5 % (ref 12.0–46.0)
Lymphs Abs: 2.6 K/uL (ref 0.7–4.0)
MCHC: 33 g/dL (ref 30.0–36.0)
MCV: 80.7 fl (ref 78.0–100.0)
Monocytes Absolute: 0.7 K/uL (ref 0.1–1.0)
Monocytes Relative: 6.9 % (ref 3.0–12.0)
Neutro Abs: 6.9 K/uL (ref 1.4–7.7)
Neutrophils Relative %: 64.6 % (ref 43.0–77.0)
Platelets: 402 K/uL — ABNORMAL HIGH (ref 150.0–400.0)
RBC: 5.05 Mil/uL (ref 4.22–5.81)
RDW: 15.2 % (ref 11.5–15.5)
WBC: 10.8 K/uL — ABNORMAL HIGH (ref 4.0–10.5)

## 2024-02-03 LAB — C-REACTIVE PROTEIN: CRP: 1.6 mg/dL (ref 0.5–20.0)

## 2024-02-03 MED ORDER — MUPIROCIN 2 % EX OINT: 1.0000 | TOPICAL_OINTMENT | Freq: Every day | CUTANEOUS | 0 refills | Status: AC

## 2024-02-03 MED ORDER — CARVEDILOL 3.125 MG PO TABS: 3.1250 mg | ORAL_TABLET | Freq: Two times a day (BID) | ORAL | 3 refills | Status: DC

## 2024-02-03 NOTE — Progress Notes (Signed)
 Assessment & Plan   Assessment/Plan:    Assessment & Plan Status post right fifth toe amputation for osteomyelitis with healing surgical wound, right foot Postoperative course was uncomplicated. Wound is healing well with no signs of infection, drainage, or fever. Pain is managed with ibuprofen  and acetaminophen . - Examine surgical wound - Apply mupirocin  to wound once daily - Use nonstick gauze for dressing - Follow-up with orthopedics next Friday  Suspected pressure ulcer, right ankle region Potential pressure ulcer at the base of the right ankle with no signs of infection. - Apply mupirocin  to prevent infection  Obstructive sleep apnea CPAP machine is broken and requires replacement. No recent follow-up with pulmonology. - Refer to pulmonology for CPAP machine replacement and sleep study  Essential hypertension Blood pressure is 140/80 mmHg. Current medications include furosemide , hydrochlorothiazide , losartan , and spironolactone . Consider adding carvedilol  for better control and vascular protection. - Starting carvedilol  3.125 mg twice daily - Order ultrasound to assess blood flow and check for peripheral arterial disease - Recheck blood pressure - Order CBC, CRP, and metabolic panel  Prediabetes Recent hemoglobin A1c was 6.2. Managed with metformin  500 mg daily.  Mixed hyperlipidemia Managed with pravastatin  10 mg daily, well-tolerated without myalgias.      There are no discontinued medications.  Return in about 1 month (around 03/04/2024) for BP.        Subjective:   Encounter date: 02/03/2024  Tyler Carroll is a 49 y.o. male who has Peripheral edema; Essential hypertension; Chronic low back pain without sciatica; Chronic pain of right knee; Super obesity; Vitamin D  deficiency; Prediabetes; Hyperlipidemia; Statin intolerance; OSA (obstructive sleep apnea); Acute osteomyelitis of toe of right foot (HCC); Lymphedema of both lower extremities; and Amputation  of toe of right foot (HCC) on their problem list..   He  has a past medical history of Anemia (12/09/2022), Dental caries (01/05/2023), Frequency of urination and polyuria (01/05/2023), Hypertension, Hypokalemia (11/03/2022), Lumbago, Pre-diabetes, and Sleep apnea.SABRA   He presents with chief complaint of Hypertension (1 month follow up HTN and lab work //HM due- vaccinations ), Wound Infection (Pt went to ED on 01/23/2024 due to right foot ulcer with amputation performed (rt pinky toe) ), and Obstructive Sleep Apnea (Pt is requesting new CPAP machine due to old one broken ) .   Discussed the use of AI scribe software for clinical note transcription with the patient, who gave verbal consent to proceed.  History of Present Illness Tyler Carroll is a 48 year old male with hypertension and osteomyelitis who presents for blood pressure and hospital follow-up.  He was hospitalized from August 23 to January 25, 2024, for ulceration of the right fifth toe due to osteomyelitis. An MRI confirmed the diagnosis, and he was treated with broad-spectrum antibiotics and surgery. His postoperative course was uncomplicated. He reports that his foot is healing, and he has not had any issues with it. He manages pain with ibuprofen  and Tylenol . He has a follow-up with orthopedics scheduled for next Friday.  He has a history of hypertension, which was mildly elevated today at 140/80 mmHg. During his hospitalization, some blood pressure medications were held but have since been restarted. His current medications include furosemide  40 mg daily, hydrochlorothiazide  25 mg daily, losartan  100 mg daily, and spironolactone  50 mg daily. No headaches or blurry vision.  He has sleep apnea, and his CPAP machine is broken. He has not seen a pulmonologist in years since his previous doctor moved away. He requires a new CPAP machine and  possibly a new sleep study.  He has a history of prediabetes with a recent hemoglobin A1c  of 6.2 and is on metformin  500 mg daily. He also has hyperlipidemia and experiences significant myalgias with multiple medications, but pravastatin  10 mg has been effective without causing myalgias.  No fevers since completing his course of Augmentin . He was discharged with instructions to continue Augmentin  and has completed the course. He is currently nine days post-hospitalization.     ROS  Past Surgical History:  Procedure Laterality Date   AMPUTATION TOE Right 01/24/2024   Procedure: AMPUTATION, TOE;  Surgeon: Barton Drape, MD;  Location: [facility] OR;  Service: Orthopedics;  Laterality: Right;  RIGHT 5TH TOE   WISDOM TOOTH EXTRACTION Bilateral     Outpatient Medications Prior to Visit  Medication Sig Dispense Refill   acetaminophen  (TYLENOL ) 500 MG tablet Take 1,000 mg by mouth every 6 (six) hours as needed for mild pain (pain score 1-3) or moderate pain (pain score 4-6).     amoxicillin -clavulanate (AUGMENTIN ) 875-125 MG tablet Take 1 tablet by mouth 2 (two) times daily. 6 tablet 0   Azelastine  HCl 137 MCG/SPRAY SOLN PLACE 2 SPRAYS INTO BOTH NOSTRILS 2 (TWO) TIMES DAILY. USE IN EACH NOSTRIL AS DIRECTED 30 mL 1   fluticasone  (FLONASE ) 50 MCG/ACT nasal spray Place 2 sprays into both nostrils daily. (Patient taking differently: Place 1 spray into both nostrils every evening.) 48 mL 1   furosemide  (LASIX ) 40 MG tablet Take 1 tablet (40 mg total) by mouth daily. (Patient taking differently: Take 40 mg by mouth every evening.) 90 tablet 3   hydrochlorothiazide  (HYDRODIURIL ) 25 MG tablet Take 1 tablet (25 mg total) by mouth daily. (Patient taking differently: Take 25 mg by mouth every evening.) 90 tablet 3   ibuprofen  (ADVIL ) 200 MG tablet Take 800 mg by mouth every 8 (eight) hours as needed for mild pain (pain score 1-3) or moderate pain (pain score 4-6).     losartan  (COZAAR ) 100 MG tablet Take 1 tablet (100 mg total) by mouth daily. (Patient taking differently: Take 100 mg by mouth every  evening.) 90 tablet 0   metFORMIN  (GLUCOPHAGE -XR) 500 MG 24 hr tablet Take 1 tablet (500 mg total) by mouth daily with breakfast. (Patient taking differently: Take 500 mg by mouth daily with supper.) 90 tablet 3   Multiple Vitamins-Minerals (MULTIVITAMIN MEN 50+) TABS Take 1 tablet by mouth every evening.     nutrition supplement, JUVEN, (JUVEN) PACK Take 1 packet by mouth 2 (two) times daily between meals. 30 packet 0   Omega-3 Fatty Acids (FISH OIL) 1000 MG CAPS Take 1,000 mg by mouth every evening.     potassium chloride  SA (KLOR-CON  M) 20 MEQ tablet Take 1 tablet (20 mEq total) by mouth 2 (two) times daily. 180 tablet 0   pravastatin  (PRAVACHOL ) 10 MG tablet Take 1 tablet (10 mg total) by mouth daily. (Patient taking differently: Take 10 mg by mouth every evening.) 90 tablet 3   predniSONE  (DELTASONE ) 20 MG tablet TAKE 2 TABLETS BY MOUTH EVERY DAY FOR 5 DAYS     spironolactone  (ALDACTONE ) 50 MG tablet Take 1 tablet (50 mg total) by mouth daily. (Patient taking differently: Take 50 mg by mouth every evening.) 90 tablet 3   No facility-administered medications prior to visit.    Family History  Problem Relation Age of Onset   Hypertension Mother    Heart failure Father     Social History   Socioeconomic History   Marital  status: Single    Spouse name: Not on file   Number of children: Not on file   Years of education: Not on file   Highest education level: Not on file  Occupational History   Not on file  Tobacco Use   Smoking status: Never    Passive exposure: Never   Smokeless tobacco: Never  Vaping Use   Vaping status: Never Used  Substance and Sexual Activity   Alcohol use: Not Currently   Drug use: Never   Sexual activity: Yes    Birth control/protection: None    Comment: Married  Other Topics Concern   Not on file  Social History Narrative   Not on file   Social Drivers of Health   Financial Resource Strain: Not on file  Food Insecurity: No Food Insecurity  (01/24/2024)   Hunger Vital Sign    Worried About Running Out of Food in the Last Year: Never true    Ran Out of Food in the Last Year: Never true  Transportation Needs: No Transportation Needs (01/24/2024)   PRAPARE - Administrator, Civil Service (Medical): No    Lack of Transportation (Non-Medical): No  Physical Activity: Not on file  Stress: Not on file  Social Connections: Not on file  Intimate Partner Violence: Not At Risk (01/24/2024)   Humiliation, Afraid, Rape, and Kick questionnaire    Fear of Current or Ex-Partner: No    Emotionally Abused: No    Physically Abused: No    Sexually Abused: No                                                                                                  Objective:  Physical Exam: BP (!) 140/80 (BP Location: Left Arm, Patient Position: Sitting, Cuff Size: Large) Comment: done manual  Pulse 98   Temp (!) 96.8 F (36 C) (Temporal)   Resp 18   Wt (!) 465 lb 3.2 oz (211 kg)   SpO2 100%   BMI 63.08 kg/m    Physical Exam VITALS: BP- 140/80 GENERAL: Alert, cooperative, well developed, no acute distress. HEENT: Normocephalic, normal oropharynx, moist mucous membranes. CHEST: Clear to auscultation bilaterally, no wheezes, rhonchi, or crackles. CARDIOVASCULAR: Normal heart rate and rhythm, S1 and S2 normal without murmurs. ABDOMEN: Soft, non-tender, non-distended, without organomegaly, normal bowel sounds. EXTREMITIES: 3+ pitting edema bilaterally NEUROLOGICAL: Cranial nerves grossly intact, moves all extremities without gross motor or sensory deficit. SKIN: Potential pressure ulcer at base of right ankle, right toe amputation incision with stitches present, no signs of infection or drainage, in healing state with good granulation tissue   Physical Exam  MR FOOT RIGHT W WO CONTRAST Result Date: 01/23/2024 CLINICAL DATA:  Osteonecrosis suspect of the right foot. X-ray done. EXAM: MRI OF THE RIGHT FOREFOOT WITHOUT AND WITH  CONTRAST TECHNIQUE: Multiplanar, multisequence MR imaging of the right forefoot was performed before and after the administration of intravenous contrast. CONTRAST:  10mL GADAVIST  GADOBUTROL  1 MMOL/ML IV SOLN COMPARISON:  Right foot radiographs 01/23/2024 FINDINGS: Motion artifact limits the examination. Bones/Joint/Cartilage Abnormal marrow signal intensity demonstrated throughout  the proximal and distal phalanges of the fifth toe. There is loss of fat T1 signal and increased T2 signal intensity with contrast enhancement demonstrated. This is consistent with osteomyelitis. Marrow signal intensities in the first through fourth toes appears normal. Degenerative changes demonstrated in the interphalangeal joints and first metatarsal-phalangeal joint. Ligaments Grossly intact appearance of the visualized ligaments. Muscles and Tendons Visualized flexor and extensor tendons appear grossly intact. No intramuscular mass or abscess demonstrated. Soft tissues Fluid signal demonstrated throughout the dorsum of the foot consistent with soft tissue edema. No loculated collections are seen. Heterogeneous contrast enhancement demonstrated in the soft tissues around the fifth toe consistent with cellulitis. IMPRESSION: 1. Abnormal marrow signal intensity and contrast enhancement involving fifth toe consistent with osteomyelitis. 2. Soft tissue enhancement adjacent to the fifth toe likely due to cellulitis. No loculated abscess identified. 3. Diffuse subcutaneous soft tissue swelling over the dorsum of the forefoot. 4. Degenerative changes in the interphalangeal joints and first metatarsal-phalangeal joint. Electronically Signed   By: Elsie Gravely M.D.   On: 01/23/2024 21:39   DG Foot Complete Right Result Date: 01/23/2024 CLINICAL DATA:  Right foot wound.  Concern for osteomyelitis. EXAM: RIGHT FOOT COMPLETE - 3+ VIEW COMPARISON:  None Available. FINDINGS: No acute fracture or dislocation. No evidence of acute osteolysis  or erosive changes. Suspected ulcerations overlying the medial first metatarsal head and the lateral fifth toe. No definite soft tissue gas. Mild degenerative arthropathy first and fifth MTP and talonavicular joints. Os peroneum is noted. Calcaneal enthesopathy. Generalized subcutaneous edema of the right lower extremity and foot. IMPRESSION: 1. Suspected ulcerations overlying the medial first metatarsal head and the lateral fifth toe. No radiographic evidence to suggest acute osteomyelitis at this time. If there is persistent clinical concern, MRI could be obtained for further evaluation. 2. Mild degenerative arthropathy throughout the foot. 3. Generalized nonspecific subcutaneous edema of the right lower extremity and foot. Electronically Signed   By: Harrietta Sherry M.D.   On: 01/23/2024 16:58    Recent Results (from the past 2160 hours)  CBC with Differential     Status: Abnormal   Collection Time: 01/23/24  2:28 PM  Result Value Ref Range   WBC 9.8 4.0 - 10.5 K/uL   RBC 4.77 4.22 - 5.81 MIL/uL   Hemoglobin 12.8 (L) 13.0 - 17.0 g/dL   HCT 59.9 60.9 - 47.9 %   MCV 83.9 80.0 - 100.0 fL   MCH 26.8 26.0 - 34.0 pg   MCHC 32.0 30.0 - 36.0 g/dL   RDW 85.7 88.4 - 84.4 %   Platelets 384 150 - 400 K/uL   nRBC 0.0 0.0 - 0.2 %   Neutrophils Relative % 64 %   Neutro Abs 6.3 1.7 - 7.7 K/uL   Lymphocytes Relative 25 %   Lymphs Abs 2.4 0.7 - 4.0 K/uL   Monocytes Relative 7 %   Monocytes Absolute 0.7 0.1 - 1.0 K/uL   Eosinophils Relative 4 %   Eosinophils Absolute 0.4 0.0 - 0.5 K/uL   Basophils Relative 0 %   Basophils Absolute 0.0 0.0 - 0.1 K/uL   Immature Granulocytes 0 %   Abs Immature Granulocytes 0.03 0.00 - 0.07 K/uL    Comment: Performed at [facility], [address], [place], [place] [address]  Comprehensive metabolic panel     Status: Abnormal   Collection Time: 01/23/24  2:28 PM  Result Value Ref Range   Sodium 135 135 - 145 mmol/L   Potassium 3.0 (  L) 3.5 - 5.1 mmol/L    Chloride 97 (L) 98 - 111 mmol/L   CO2 27 22 - 32 mmol/L   Glucose, Bld 126 (H) 70 - 99 mg/dL    Comment: Glucose reference range applies only to samples taken after fasting for at least 8 hours.   BUN 13 6 - 20 mg/dL   Creatinine, Ser 9.03 0.61 - 1.24 mg/dL   Calcium  9.1 8.9 - 10.3 mg/dL   Total Protein 7.4 6.5 - 8.1 g/dL   Albumin 3.2 (L) 3.5 - 5.0 g/dL   AST 24 15 - 41 U/L   ALT 22 0 - 44 U/L   Alkaline Phosphatase 89 38 - 126 U/L   Total Bilirubin 0.4 0.0 - 1.2 mg/dL   GFR, Estimated >39 >39 mL/min    Comment: (NOTE) Calculated using the CKD-EPI Creatinine Equation (2021)    Anion gap 11 5 - 15    Comment: Performed at [facility], [address], [place], [place] [address]  Glucose, capillary     Status: Abnormal   Collection Time: 01/24/24 12:25 AM  Result Value Ref Range   Glucose-Capillary 128 (H) 70 - 99 mg/dL    Comment: Glucose reference range applies only to samples taken after fasting for at least 8 hours.  Glucose, capillary     Status: Abnormal   Collection Time: 01/24/24  7:46 AM  Result Value Ref Range   Glucose-Capillary 114 (H) 70 - 99 mg/dL    Comment: Glucose reference range applies only to samples taken after fasting for at least 8 hours.  Sedimentation rate     Status: Abnormal   Collection Time: 01/24/24  8:47 AM  Result Value Ref Range   Sed Rate 46 (H) 0 - 16 mm/hr    Comment: Performed at [facility], [address], [place], [place] [address]  C-reactive protein     Status: Abnormal   Collection Time: 01/24/24  8:47 AM  Result Value Ref Range   CRP 2.2 (H) <1.0 mg/dL    Comment: Performed at [facility], [address], [place], [place] [address]  Prealbumin     Status: None   Collection Time: 01/24/24  8:47 AM  Result Value Ref Range   Prealbumin 23 18 - 38 mg/dL    Comment: Performed at [facility], [address], [place], [place] [address]  HIV Antibody (routine testing w rflx)     Status: None   Collection Time:  01/24/24  8:47 AM  Result Value Ref Range   HIV Screen 4th Generation wRfx Non Reactive Non Reactive    Comment: Performed at [facility], [address] [place], [place] [address]  Hemoglobin A1c     Status: Abnormal   Collection Time: 01/24/24  8:47 AM  Result Value Ref Range   Hgb A1c MFr Bld 6.2 (H) 4.8 - 5.6 %    Comment: (NOTE) Diagnosis of Diabetes The following HbA1c ranges recommended by the American Diabetes Association (ADA) may be used as an aid in the diagnosis of diabetes mellitus.  Hemoglobin             Suggested A1C NGSP%              Diagnosis  <5.7                   Non Diabetic  5.7-6.4  Pre-Diabetic  >6.4                   Diabetic  <7.0                   Glycemic control for                       adults with diabetes.     Mean Plasma Glucose 131.24 mg/dL    Comment: Performed at [facility], [address] [place], [place] [address]  CBC with Differential/Platelet     Status: Abnormal   Collection Time: 01/24/24  8:47 AM  Result Value Ref Range   WBC 7.9 4.0 - 10.5 K/uL   RBC 4.47 4.22 - 5.81 MIL/uL   Hemoglobin 11.8 (L) 13.0 - 17.0 g/dL   HCT 63.4 (L) 60.9 - 47.9 %   MCV 81.7 80.0 - 100.0 fL   MCH 26.4 26.0 - 34.0 pg   MCHC 32.3 30.0 - 36.0 g/dL   RDW 85.5 88.4 - 84.4 %   Platelets 333 150 - 400 K/uL   nRBC 0.0 0.0 - 0.2 %   Neutrophils Relative % 59 %   Neutro Abs 4.7 1.7 - 7.7 K/uL   Lymphocytes Relative 27 %   Lymphs Abs 2.1 0.7 - 4.0 K/uL   Monocytes Relative 9 %   Monocytes Absolute 0.7 0.1 - 1.0 K/uL   Eosinophils Relative 4 %   Eosinophils Absolute 0.3 0.0 - 0.5 K/uL   Basophils Relative 1 %   Basophils Absolute 0.0 0.0 - 0.1 K/uL   Immature Granulocytes 0 %   Abs Immature Granulocytes 0.03 0.00 - 0.07 K/uL    Comment: Performed at [facility], [address], [place], [place] [address]  Surgical pcr screen     Status: Abnormal   Collection Time: 01/24/24 10:28 AM   Specimen: Nasal Mucosa;  Nasal Swab  Result Value Ref Range   MRSA, PCR POSITIVE (A) NEGATIVE    Comment: RESULT CALLED TO, READ BACK BY AND VERIFIED WITH: RN LUKE MEISSNER [id number] AT 1219 BY EC    Staphylococcus aureus POSITIVE (A) NEGATIVE    Comment: (NOTE) The Xpert SA Assay (FDA approved for NASAL specimens in patients 19 years of age and older), is one component of a comprehensive surveillance program. It is not intended to diagnose infection nor to guide or monitor treatment. Performed at [facility], [address] [place], [place] [address]   Glucose, capillary     Status: Abnormal   Collection Time: 01/24/24 11:53 AM  Result Value Ref Range   Glucose-Capillary 111 (H) 70 - 99 mg/dL    Comment: Glucose reference range applies only to samples taken after fasting for at least 8 hours.  Aerobic/Anaerobic Culture w Gram Stain (surgical/deep wound)     Status: None   Collection Time: 01/24/24  1:39 PM   Specimen: Wound; Tissue  Result Value Ref Range   Specimen Description WOUND    Special Requests RIGHT FIFTH TOE AMPUTATION    Gram Stain      RARE WBC PRESENT, PREDOMINANTLY PMN FEW GRAM POSITIVE COCCI    Culture      MODERATE METHICILLIN RESISTANT STAPHYLOCOCCUS AUREUS RARE ACINETOBACTER CALCOACETICUS/BAUMANNII COMPLEX NO ANAEROBES ISOLATED Performed at [facility], [address] [place], [place] [address]    Report Status 01/29/2024 FINAL    Organism ID, Bacteria METHICILLIN RESISTANT STAPHYLOCOCCUS AUREUS  Organism ID, Bacteria ACINETOBACTER CALCOACETICUS/BAUMANNII COMPLEX       Susceptibility   Acinetobacter calcoaceticus/baumannii complex - MIC*    MINOCYCLINE <=0.5 SENSITIVE Sensitive     IMIPENEM <=0.5 SENSITIVE Sensitive     PIP/TAZO Value in next row Sensitive ug/mL     <=4 SENSITIVEThis is a modified FDA-approved test that has been validated and its performance characteristics determined by the reporting laboratory.  This laboratory is certified under the Clinical  Laboratory Improvement Amendments CLIA as qualified to perform high complexity clinical laboratory testing.    AMPICILLIN/SULBACTAM Value in next row Sensitive      <=4 SENSITIVEThis is a modified FDA-approved test that has been validated and its performance characteristics determined by the reporting laboratory.  This laboratory is certified under the Clinical Laboratory Improvement Amendments CLIA as qualified to perform high complexity clinical laboratory testing.    MEROPENEM Value in next row Sensitive      <=4 SENSITIVEThis is a modified FDA-approved test that has been validated and its performance characteristics determined by the reporting laboratory.  This laboratory is certified under the Clinical Laboratory Improvement Amendments CLIA as qualified to perform high complexity clinical laboratory testing.    * RARE ACINETOBACTER CALCOACETICUS/BAUMANNII COMPLEX   Methicillin resistant staphylococcus aureus - MIC*    CIPROFLOXACIN Value in next row Sensitive      <=4 SENSITIVEThis is a modified FDA-approved test that has been validated and its performance characteristics determined by the reporting laboratory.  This laboratory is certified under the Clinical Laboratory Improvement Amendments CLIA as qualified to perform high complexity clinical laboratory testing.    ERYTHROMYCIN Value in next row Resistant      <=4 SENSITIVEThis is a modified FDA-approved test that has been validated and its performance characteristics determined by the reporting laboratory.  This laboratory is certified under the Clinical Laboratory Improvement Amendments CLIA as qualified to perform high complexity clinical laboratory testing.    GENTAMICIN Value in next row Sensitive      <=4 SENSITIVEThis is a modified FDA-approved test that has been validated and its performance characteristics determined by the reporting laboratory.  This laboratory is certified under the Clinical Laboratory Improvement Amendments CLIA as  qualified to perform high complexity clinical laboratory testing.    OXACILLIN Value in next row Resistant      <=4 SENSITIVEThis is a modified FDA-approved test that has been validated and its performance characteristics determined by the reporting laboratory.  This laboratory is certified under the Clinical Laboratory Improvement Amendments CLIA as qualified to perform high complexity clinical laboratory testing.    TETRACYCLINE Value in next row Sensitive      <=4 SENSITIVEThis is a modified FDA-approved test that has been validated and its performance characteristics determined by the reporting laboratory.  This laboratory is certified under the Clinical Laboratory Improvement Amendments CLIA as qualified to perform high complexity clinical laboratory testing.    VANCOMYCIN  Value in next row Sensitive      <=4 SENSITIVEThis is a modified FDA-approved test that has been validated and its performance characteristics determined by the reporting laboratory.  This laboratory is certified under the Clinical Laboratory Improvement Amendments CLIA as qualified to perform high complexity clinical laboratory testing.    TRIMETH/SULFA Value in next row Sensitive      <=4 SENSITIVEThis is a modified FDA-approved test that has been validated and its performance characteristics determined by the reporting laboratory.  This laboratory is certified under the Clinical Laboratory Improvement Amendments CLIA as qualified to perform high  complexity clinical laboratory testing.    CLINDAMYCIN Value in next row Sensitive      <=4 SENSITIVEThis is a modified FDA-approved test that has been validated and its performance characteristics determined by the reporting laboratory.  This laboratory is certified under the Clinical Laboratory Improvement Amendments CLIA as qualified to perform high complexity clinical laboratory testing.    RIFAMPIN Value in next row Sensitive      <=4 SENSITIVEThis is a modified FDA-approved test  that has been validated and its performance characteristics determined by the reporting laboratory.  This laboratory is certified under the Clinical Laboratory Improvement Amendments CLIA as qualified to perform high complexity clinical laboratory testing.    Inducible Clindamycin Value in next row Sensitive      <=4 SENSITIVEThis is a modified FDA-approved test that has been validated and its performance characteristics determined by the reporting laboratory.  This laboratory is certified under the Clinical Laboratory Improvement Amendments CLIA as qualified to perform high complexity clinical laboratory testing.    LINEZOLID Value in next row Sensitive      <=4 SENSITIVEThis is a modified FDA-approved test that has been validated and its performance characteristics determined by the reporting laboratory.  This laboratory is certified under the Clinical Laboratory Improvement Amendments CLIA as qualified to perform high complexity clinical laboratory testing.    * MODERATE METHICILLIN RESISTANT STAPHYLOCOCCUS AUREUS  Aerobic/Anaerobic Culture w Gram Stain (surgical/deep wound)     Status: None   Collection Time: 01/24/24  1:51 PM   Specimen: PATH Digit amputation; Tissue  Result Value Ref Range   Specimen Description TISSUE    Special Requests RIGHT FIFTH TOE AMPUTATION,PATH DIGIT AMPUTATION    Gram Stain      RARE WBC PRESENT, PREDOMINANTLY PMN RARE GRAM POSITIVE COCCI    Culture      FEW STAPHYLOCOCCUS AUREUS SUSCEPTIBILITIES PERFORMED ON PREVIOUS CULTURE WITHIN THE LAST 5 DAYS. RARE KLEBSIELLA AEROGENES RARE STAPHYLOCOCCUS EPIDERMIDIS NO ANAEROBES ISOLATED Performed at [facility], [address] [place], [place] [address]    Report Status 01/29/2024 FINAL    Organism ID, Bacteria KLEBSIELLA AEROGENES    Organism ID, Bacteria STAPHYLOCOCCUS EPIDERMIDIS       Susceptibility   Klebsiella aerogenes - MIC*    CEFEPIME 2 SENSITIVE Sensitive     ERTAPENEM 1 INTERMEDIATE  Intermediate     CEFTRIAXONE  8 RESISTANT Resistant     CIPROFLOXACIN <=0.06 SENSITIVE Sensitive     GENTAMICIN <=1 SENSITIVE Sensitive     MEROPENEM <=0.25 SENSITIVE Sensitive     TRIMETH/SULFA <=20 SENSITIVE Sensitive     PIP/TAZO Value in next row Sensitive ug/mL     <=4 SENSITIVEThis is a modified FDA-approved test that has been validated and its performance characteristics determined by the reporting laboratory.  This laboratory is certified under the Clinical Laboratory Improvement Amendments CLIA as qualified to perform high complexity clinical laboratory testing.    * RARE KLEBSIELLA AEROGENES   Staphylococcus epidermidis - MIC*    CIPROFLOXACIN Value in next row Sensitive      <=4 SENSITIVEThis is a modified FDA-approved test that has been validated and its performance characteristics determined by the reporting laboratory.  This laboratory is certified under the Clinical Laboratory Improvement Amendments CLIA as qualified to perform high complexity clinical laboratory testing.    ERYTHROMYCIN Value in next row Resistant      <=4 SENSITIVEThis is a modified FDA-approved test that has been validated and its performance characteristics determined by the reporting laboratory.  This  laboratory is certified under the Clinical Laboratory Improvement Amendments CLIA as qualified to perform high complexity clinical laboratory testing.    GENTAMICIN Value in next row Sensitive      <=4 SENSITIVEThis is a modified FDA-approved test that has been validated and its performance characteristics determined by the reporting laboratory.  This laboratory is certified under the Clinical Laboratory Improvement Amendments CLIA as qualified to perform high complexity clinical laboratory testing.    OXACILLIN Value in next row Resistant      <=4 SENSITIVEThis is a modified FDA-approved test that has been validated and its performance characteristics determined by the reporting laboratory.  This laboratory is  certified under the Clinical Laboratory Improvement Amendments CLIA as qualified to perform high complexity clinical laboratory testing.    TETRACYCLINE Value in next row Resistant      <=4 SENSITIVEThis is a modified FDA-approved test that has been validated and its performance characteristics determined by the reporting laboratory.  This laboratory is certified under the Clinical Laboratory Improvement Amendments CLIA as qualified to perform high complexity clinical laboratory testing.    VANCOMYCIN  Value in next row Sensitive      <=4 SENSITIVEThis is a modified FDA-approved test that has been validated and its performance characteristics determined by the reporting laboratory.  This laboratory is certified under the Clinical Laboratory Improvement Amendments CLIA as qualified to perform high complexity clinical laboratory testing.    TRIMETH/SULFA Value in next row Resistant      <=4 SENSITIVEThis is a modified FDA-approved test that has been validated and its performance characteristics determined by the reporting laboratory.  This laboratory is certified under the Clinical Laboratory Improvement Amendments CLIA as qualified to perform high complexity clinical laboratory testing.    CLINDAMYCIN Value in next row Sensitive      <=4 SENSITIVEThis is a modified FDA-approved test that has been validated and its performance characteristics determined by the reporting laboratory.  This laboratory is certified under the Clinical Laboratory Improvement Amendments CLIA as qualified to perform high complexity clinical laboratory testing.    RIFAMPIN Value in next row Sensitive      <=4 SENSITIVEThis is a modified FDA-approved test that has been validated and its performance characteristics determined by the reporting laboratory.  This laboratory is certified under the Clinical Laboratory Improvement Amendments CLIA as qualified to perform high complexity clinical laboratory testing.    Inducible Clindamycin  Value in next row Sensitive      <=4 SENSITIVEThis is a modified FDA-approved test that has been validated and its performance characteristics determined by the reporting laboratory.  This laboratory is certified under the Clinical Laboratory Improvement Amendments CLIA as qualified to perform high complexity clinical laboratory testing.    * RARE STAPHYLOCOCCUS EPIDERMIDIS  Glucose, capillary     Status: Abnormal   Collection Time: 01/24/24  5:57 PM  Result Value Ref Range   Glucose-Capillary 143 (H) 70 - 99 mg/dL    Comment: Glucose reference range applies only to samples taken after fasting for at least 8 hours.  Glucose, capillary     Status: Abnormal   Collection Time: 01/24/24  8:57 PM  Result Value Ref Range   Glucose-Capillary 162 (H) 70 - 99 mg/dL    Comment: Glucose reference range applies only to samples taken after fasting for at least 8 hours.  Basic metabolic panel with GFR     Status: Abnormal   Collection Time: 01/24/24  9:27 PM  Result Value Ref Range   Sodium 137 135 -  145 mmol/L   Potassium 3.6 3.5 - 5.1 mmol/L   Chloride 101 98 - 111 mmol/L   CO2 26 22 - 32 mmol/L   Glucose, Bld 134 (H) 70 - 99 mg/dL    Comment: Glucose reference range applies only to samples taken after fasting for at least 8 hours.   BUN 9 6 - 20 mg/dL   Creatinine, Ser 9.13 0.61 - 1.24 mg/dL   Calcium  8.5 (L) 8.9 - 10.3 mg/dL   GFR, Estimated >39 >39 mL/min    Comment: (NOTE) Calculated using the CKD-EPI Creatinine Equation (2021)    Anion gap 10 5 - 15    Comment: Performed at [facility], [address] [place], [place] [address]  CBC     Status: Abnormal   Collection Time: 01/25/24  5:25 AM  Result Value Ref Range   WBC 7.5 4.0 - 10.5 K/uL   RBC 4.35 4.22 - 5.81 MIL/uL   Hemoglobin 11.5 (L) 13.0 - 17.0 g/dL   HCT 62.8 (L) 60.9 - 47.9 %   MCV 85.3 80.0 - 100.0 fL   MCH 26.4 26.0 - 34.0 pg   MCHC 31.0 30.0 - 36.0 g/dL   RDW 85.3 88.4 - 84.4 %   Platelets 325 150 - 400 K/uL    nRBC 0.0 0.0 - 0.2 %    Comment: Performed at [facility], [address] [place], [place] [address]  Basic metabolic panel with GFR     Status: Abnormal   Collection Time: 01/25/24  5:25 AM  Result Value Ref Range   Sodium 135 135 - 145 mmol/L   Potassium 3.8 3.5 - 5.1 mmol/L   Chloride 105 98 - 111 mmol/L   CO2 27 22 - 32 mmol/L   Glucose, Bld 110 (H) 70 - 99 mg/dL    Comment: Glucose reference range applies only to samples taken after fasting for at least 8 hours.   BUN 10 6 - 20 mg/dL   Creatinine, Ser 9.15 0.61 - 1.24 mg/dL   Calcium  8.4 (L) 8.9 - 10.3 mg/dL   GFR, Estimated >39 >39 mL/min    Comment: (NOTE) Calculated using the CKD-EPI Creatinine Equation (2021)    Anion gap 3 (L) 5 - 15    Comment: Performed at [facility], [address], [place], [place] [address]  Glucose, capillary     Status: Abnormal   Collection Time: 01/25/24  7:51 AM  Result Value Ref Range   Glucose-Capillary 114 (H) 70 - 99 mg/dL    Comment: Glucose reference range applies only to samples taken after fasting for at least 8 hours.  Glucose, capillary     Status: Abnormal   Collection Time: 01/25/24 11:16 AM  Result Value Ref Range   Glucose-Capillary 162 (H) 70 - 99 mg/dL    Comment: Glucose reference range applies only to samples taken after fasting for at least 8 hours.  Glucose, capillary     Status: Abnormal   Collection Time: 01/25/24  4:32 PM  Result Value Ref Range   Glucose-Capillary 120 (H) 70 - 99 mg/dL    Comment: Glucose reference range applies only to samples taken after fasting for at least 8 hours.        Beverley Adine Hummer, MD, MS

## 2024-02-03 NOTE — Patient Instructions (Signed)
  VISIT SUMMARY: Today, you came in for a follow-up on your blood pressure and recent hospitalization for your right fifth toe. You were hospitalized from August 23 to January 25, 2024, for an ulceration due to osteomyelitis, which was treated with antibiotics and surgery. Your foot is healing well, and you have a follow-up with orthopedics scheduled for next Friday. We also discussed your hypertension, sleep apnea, prediabetes, and hyperlipidemia.  YOUR PLAN: -STATUS POST RIGHT FIFTH TOE AMPUTATION FOR OSTEOMYELITIS: Osteomyelitis is a bone infection that required surgery to remove the infected part of your toe. Your surgical wound is healing well with no signs of infection. Continue to apply mupirocin  to the wound once daily and use nonstick gauze for dressing. Follow up with orthopedics next Friday.  -SUSPECTED PRESSURE ULCER, RIGHT ANKLE REGION: A pressure ulcer is a sore that develops due to prolonged pressure on the skin. There is a potential pressure ulcer at the base of your right ankle with no signs of infection. Apply mupirocin  to the area to prevent infection.  -OBSTRUCTIVE SLEEP APNEA: Obstructive sleep apnea is a condition where your breathing stops and starts during sleep. Your CPAP machine is broken and needs replacement. You will be referred to pulmonology for a new CPAP machine and possibly a new sleep study.  -ESSENTIAL HYPERTENSION: Hypertension is high blood pressure. Your blood pressure today was 140/80 mmHg. We may add carvedilol  3.125 mg twice daily to your current medications for better control. An ultrasound will be ordered to check blood flow and for peripheral arterial disease. We will also recheck your blood pressure and order some blood tests (CBC, CRP, and metabolic panel).  -PREDIABETES: Prediabetes is a condition where your blood sugar levels are higher than normal but not high enough to be diabetes. Your recent hemoglobin A1c was 6.2, and you are managing it with metformin   500 mg daily.  -MIXED HYPERLIPIDEMIA: Hyperlipidemia is having high levels of fats in your blood. You are managing it with pravastatin  10 mg daily, which you tolerate well without muscle pain.  INSTRUCTIONS: Follow up with orthopedics next Friday for your surgical wound. You will be referred to pulmonology for a new CPAP machine and possibly a new sleep study. We may start you on carvedilol  3.125 mg twice daily for better blood pressure control. An ultrasound will be ordered to check blood flow and for peripheral arterial disease. We will also recheck your blood pressure and order some blood tests (CBC, CRP, and metabolic panel).

## 2024-02-10 ENCOUNTER — Ambulatory Visit (HOSPITAL_COMMUNITY)
Admission: RE | Admit: 2024-02-10 | Discharge: 2024-02-10 | Disposition: A | Discharge status: Home / Self Care | Source: Ambulatory Visit | Attending: Family Medicine | Admitting: Family Medicine

## 2024-02-10 ENCOUNTER — Ambulatory Visit: Payer: Self-pay | Admitting: Family Medicine

## 2024-02-10 DIAGNOSIS — M86171 Other acute osteomyelitis, right ankle and foot: Secondary | ICD-10-CM | POA: Diagnosis not present

## 2024-02-10 DIAGNOSIS — I739 Peripheral vascular disease, unspecified: Secondary | ICD-10-CM

## 2024-02-10 LAB — VAS US ABI WITH/WO TBI
Left ABI: 0.88
Right ABI: 0.87

## 2024-02-10 MED ORDER — ASPIRIN 81 MG PO TBEC: 81.0000 mg | DELAYED_RELEASE_TABLET | Freq: Every day | ORAL | 3 refills | Status: AC

## 2024-02-15 ENCOUNTER — Encounter: Payer: Self-pay | Admitting: Adult Health

## 2024-02-15 ENCOUNTER — Ambulatory Visit: Admitting: Adult Health

## 2024-02-15 VITALS — BP 162/77 | HR 95 | Temp 98.2°F | Ht 72.0 in | Wt >= 6400 oz

## 2024-02-15 DIAGNOSIS — Z6841 Body Mass Index (BMI) 40.0 and over, adult: Secondary | ICD-10-CM

## 2024-02-15 DIAGNOSIS — G4733 Obstructive sleep apnea (adult) (pediatric): Secondary | ICD-10-CM

## 2024-02-15 DIAGNOSIS — R0683 Snoring: Secondary | ICD-10-CM

## 2024-02-15 NOTE — Patient Instructions (Addendum)
 Set up for home sleep study.  Work on healthy weight loss.  Do not drive if sleepy  Follow up in 5-6 weeks to review results and treatment plan.

## 2024-02-15 NOTE — Progress Notes (Signed)
 @Patient  ID: Tyler Carroll, male    DOB: Oct 12, 1975, 48 y.o.   MRN: [id number]  Chief Complaint  Patient presents with   Consult    Referring provider: Sebastian Beverley NOVAK, MD  HPI: 48 yo male seen for sleep consult 02/15/24 to establish for sleep apnea    TEST/EVENTS :   02/15/2024 Sleep consult  Discussed the use of AI scribe software for clinical note transcription with the patient, who gave verbal consent to proceed.  History of Present Illness Tyler Carroll is a 48 year old male with sleep apnea who presents for a sleep consult to establish for sleep apnea  He has a history of sleep apnea for over ten years and has been using a BIPAP machine,  The machine stopped working 2 months ago and he has not been able to use it.. Without the CPAP, he experiences snoring, daytime sleepiness, restless sleep, and his wife reports gasping for air during sleep.  He typically goes to bed around 10 PM and falls asleep within 30 minutes. He occasionally naps on weekends and consumes about two caffeinated beverages a day, usually [facility]. No history of stroke, congestive heart failure, or surgeries other than a recent amputation of the fifth toe due to osteomyelitis.  His significant medical history includes hypertension, morbid obesity, hyperlipidemia, and lymphedema of the lower extremities. He was hospitalized in August for acute osteomyelitis of the right foot, which required aggressive antibiotics and resulted in the amputation of the fifth toe.  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 He denies smoking, alcohol, or drug use. He is married, lives with his wife, and has two children, the oldest being fourteen.  Family history includes a grandmother with lung cancer due to smoking and several relatives with diabetes. No family history of heart disease or sleep apnea.  No use of sleep aids, severe  nightmares, sleepwalking, teeth grinding, or clenching.  No symptoms suspicious for cataplexy or sleep paralysis.  Epworth score is 6 out of 24.  Typically gets sleepy if he sits down to read rest or in the evening hours  No record of previous sleep study.  Unable to get machine download.  Says he has been getting supplies online.     Allergies  Allergen Reactions   Statins Other (See Comments)    Myalgia all over   Ace Inhibitors Cough   Amlodipine  Other (See Comments)    Fatigue    Immunization History  Administered Date(s) Administered   Influenza Inj Mdck Quad Pf 02/04/2018   Tdap 10/20/2023    Past Medical History:  Diagnosis Date   Anemia 12/09/2022   Dental caries 01/05/2023   Frequency of urination and polyuria 01/05/2023   Hypertension    Hypokalemia 11/03/2022   Lumbago    Pre-diabetes    Sleep apnea     Tobacco History: Social History   Tobacco Use  Smoking Status Never   Passive exposure: Never  Smokeless Tobacco Never   Counseling given: Not Answered   Outpatient Medications Prior to Visit  Medication Sig Dispense Refill   acetaminophen  (TYLENOL ) 500 MG tablet Take 1,000 mg by mouth every 6 (six) hours as needed for mild pain (pain score 1-3) or moderate pain (pain score 4-6).     aspirin  EC 81 MG tablet Take 1 tablet (81 mg total) by mouth daily. Swallow whole. 90 tablet 3   Azelastine  HCl  137 MCG/SPRAY SOLN PLACE 2 SPRAYS INTO BOTH NOSTRILS 2 (TWO) TIMES DAILY. USE IN EACH NOSTRIL AS DIRECTED 30 mL 1   carvedilol  (COREG ) 3.125 MG tablet Take 1 tablet (3.125 mg total) by mouth 2 (two) times daily with a meal. 60 tablet 3   fluticasone  (FLONASE ) 50 MCG/ACT nasal spray Place 2 sprays into both nostrils daily. 48 mL 1   furosemide  (LASIX ) 40 MG tablet Take 1 tablet (40 mg total) by mouth daily. 90 tablet 3   hydrochlorothiazide  (HYDRODIURIL ) 25 MG tablet Take 1 tablet (25 mg total) by mouth daily. (Patient taking differently: Take 25 mg by mouth every  evening.) 90 tablet 3   ibuprofen  (ADVIL ) 200 MG tablet Take 800 mg by mouth every 8 (eight) hours as needed for mild pain (pain score 1-3) or moderate pain (pain score 4-6).     losartan  (COZAAR ) 100 MG tablet Take 1 tablet (100 mg total) by mouth daily. (Patient taking differently: Take 100 mg by mouth every evening.) 90 tablet 0   metFORMIN  (GLUCOPHAGE -XR) 500 MG 24 hr tablet Take 1 tablet (500 mg total) by mouth daily with breakfast. (Patient taking differently: Take 500 mg by mouth daily with supper.) 90 tablet 3   Multiple Vitamins-Minerals (MULTIVITAMIN MEN 50+) TABS Take 1 tablet by mouth every evening.     mupirocin  ointment (BACTROBAN ) 2 % Apply 1 Application topically daily. 22 g 0   Omega-3 Fatty Acids (FISH OIL) 1000 MG CAPS Take 1,000 mg by mouth every evening.     potassium chloride  SA (KLOR-CON  M) 20 MEQ tablet Take 1 tablet (20 mEq total) by mouth 2 (two) times daily. 180 tablet 0   pravastatin  (PRAVACHOL ) 10 MG tablet Take 1 tablet (10 mg total) by mouth daily. (Patient taking differently: Take 10 mg by mouth every evening.) 90 tablet 3   spironolactone  (ALDACTONE ) 50 MG tablet Take 1 tablet (50 mg total) by mouth daily. (Patient taking differently: Take 50 mg by mouth every evening.) 90 tablet 3   amoxicillin -clavulanate (AUGMENTIN ) 875-125 MG tablet Take 1 tablet by mouth 2 (two) times daily. (Patient not taking: Reported on 02/15/2024) 6 tablet 0   nutrition supplement, JUVEN, (JUVEN) PACK Take 1 packet by mouth 2 (two) times daily between meals. (Patient not taking: Reported on 02/15/2024) 30 packet 0   predniSONE  (DELTASONE ) 20 MG tablet TAKE 2 TABLETS BY MOUTH EVERY DAY FOR 5 DAYS (Patient not taking: Reported on 02/15/2024)     No facility-administered medications prior to visit.     Review of Systems:   Constitutional:   No  weight loss, night sweats,  Fevers, chills, +fatigue, or  lassitude.  HEENT:   No headaches,  Difficulty swallowing,  Tooth/dental problems, or  Sore  throat,                No sneezing, itching, ear ache, nasal congestion, post nasal drip,   CV:  No chest pain,  Orthopnea, PND, swelling in lower extremities, anasarca, dizziness, palpitations, syncope.   GI  No heartburn, indigestion, abdominal pain, nausea, vomiting, diarrhea, change in bowel habits, loss of appetite, bloody stools.   Resp: No shortness of breath with exertion or at rest.  No excess mucus, no productive cough,  No non-productive cough,  No coughing up of blood.  No change in color of mucus.  No wheezing.  No chest wall deformity  Skin: no rash or lesions.  GU: no dysuria, change in color of urine, no urgency or frequency.  No flank  pain, no hematuria   MS: Foot surgery   Physical Exam  BP (!) 162/77   Pulse 95   Temp 98.2 F (36.8 C)   Ht 6' (1.829 m)   Wt (!) 460 lb (208.7 kg)   SpO2 96%   BMI 62.39 kg/m   GEN: A/Ox3; pleasant , NAD, well nourished    HEENT:  [place]/AT,  NOSE-clear, THROAT-clear, no lesions, no postnasal drip or exudate noted.  Class III MP airway  NECK:  Supple w/ fair ROM; no JVD; normal carotid impulses w/o bruits; no thyromegaly or nodules palpated; no lymphadenopathy.    RESP  Clear  P & A; w/o, wheezes/ rales/ or rhonchi. no accessory muscle use, no dullness to percussion  CARD:  RRR, no m/r/g, no peripheral edema, pulses intact, no cyanosis or clubbing.  GI:   Soft & nt; nml bowel sounds; no organomegaly or masses detected.   Musco: Warm bil, right walking boot  Neuro: alert, no focal deficits noted.    Skin: Warm, no lesions or rashes    Lab Results:      BNP No results found for: BNP  ProBNP   Imaging:   Administration History     None           No data to display          No results found for: NITRICOXIDE      Assessment & Plan:   No problem-specific Assessment & Plan notes found for this encounter.  Assessment and Plan Assessment & Plan Obstructive sleep apnea   He has had  obstructive sleep apnea for over ten years, previously managed with BiPAP. Current symptoms include snoring, daytime sleepiness, restless sleep, and gasping for air. His BiPAP machine is non-functional, and no recent sleep study is available.  We discussed setting up an in lab split-night sleep study however patient would like to do a home sleep study if all possible.  Order a home sleep study through [facility]. . Schedule a follow-up visit in 5-6 weeks to review sleep study results.  Depending on sleep study results may need in-lab titration study as previously on BiPAP so suspect severe or complex sleep apnea.  - discussed how weight can impact sleep and risk for sleep disordered breathing - discussed options to assist with weight loss: combination of diet modification, cardiovascular and strength training exercises   - had an extensive discussion regarding the adverse health consequences related to untreated sleep disordered breathing - specifically discussed the risks for hypertension, coronary artery disease, cardiac dysrhythmias, cerebrovascular disease, and diabetes - lifestyle modification discussed   - discussed how sleep disruption can increase risk of accidents, particularly when driving - safe driving practices were discussed     Morbid obesity  -BMI 62 Morbid obesity is contributing to complex sleep apnea and may affect breathing mechanics during sleep. Discuss weight loss strategies, including lifestyle modifications and potential pharmacotherapy. . Discuss weight loss strategies at the follow-up visit. Consider referral to the [facility].  Osteomyelitis of the right foot-fifth toe amputation.  Continue follow-up with orthopedic surgeon.    Plan  Patient Instructions  Set up for home sleep study.  Work on healthy weight loss.  Do not drive if sleepy  Follow up in 5-6 weeks to review results and treatment plan.        Madelin Stank,  NP 02/15/2024

## 2024-02-20 ENCOUNTER — Other Ambulatory Visit: Payer: Self-pay | Admitting: Family Medicine

## 2024-02-20 DIAGNOSIS — I10 Essential (primary) hypertension: Secondary | ICD-10-CM

## 2024-03-04 ENCOUNTER — Ambulatory Visit: Admitting: Family Medicine

## 2024-03-06 ENCOUNTER — Other Ambulatory Visit: Payer: Self-pay | Admitting: Family Medicine

## 2024-03-06 DIAGNOSIS — I10 Essential (primary) hypertension: Secondary | ICD-10-CM

## 2024-03-23 ENCOUNTER — Encounter: Payer: Self-pay | Admitting: Vascular Surgery

## 2024-03-23 ENCOUNTER — Ambulatory Visit: Attending: Vascular Surgery | Admitting: Vascular Surgery

## 2024-03-23 VITALS — BP 168/98 | HR 90 | Temp 98.1°F | Ht 72.0 in | Wt >= 6400 oz

## 2024-03-23 DIAGNOSIS — S98131A Complete traumatic amputation of one right lesser toe, initial encounter: Secondary | ICD-10-CM | POA: Diagnosis not present

## 2024-03-23 NOTE — Progress Notes (Signed)
 Patient ID: Tyler Carroll, male   DOB: 1975-08-11, 48 y.o.   MRN: [id number]  Reason for Consult: No chief complaint on file.   Referred by Sebastian Beverley NOVAK, MD  Subjective:     HPI:  Tyler Carroll is a 48 y.o. male without significant history of vascular disease.  He does have hypertension.  Recently underwent right small toe amputation which he states is now healing.  He also has swelling of both legs with skin changes.  He previously wore compression stockings but states that these no longer fit.  Past Medical History:  Diagnosis Date   Anemia 12/09/2022   Dental caries 01/05/2023   Frequency of urination and polyuria 01/05/2023   Hypertension    Hypokalemia 11/03/2022   Lumbago    Pre-diabetes    Sleep apnea    Family History  Problem Relation Age of Onset   Hypertension Mother    Heart failure Father    Past Surgical History:  Procedure Laterality Date   AMPUTATION TOE Right 01/24/2024   Procedure: AMPUTATION, TOE;  Surgeon: Barton Drape, MD;  Location: [facility] OR;  Service: Orthopedics;  Laterality: Right;  RIGHT 5TH TOE   WISDOM TOOTH EXTRACTION Bilateral     Short Social History:  Social History   Tobacco Use   Smoking status: Never    Passive exposure: Never   Smokeless tobacco: Never  Substance Use Topics   Alcohol use: Not Currently    Allergies  Allergen Reactions   Statins Other (See Comments)    Myalgia all over   Ace Inhibitors Cough   Amlodipine  Other (See Comments)    Fatigue    Current Outpatient Medications  Medication Sig Dispense Refill   acetaminophen  (TYLENOL ) 500 MG tablet Take 1,000 mg by mouth every 6 (six) hours as needed for mild pain (pain score 1-3) or moderate pain (pain score 4-6).     amoxicillin -clavulanate (AUGMENTIN ) 875-125 MG tablet Take 1 tablet by mouth 2 (two) times daily. (Patient not taking: Reported on 02/15/2024) 6 tablet 0   aspirin  EC 81 MG tablet Take 1 tablet (81 mg total) by mouth daily. Swallow whole.  90 tablet 3   Azelastine  HCl 137 MCG/SPRAY SOLN PLACE 2 SPRAYS INTO BOTH NOSTRILS 2 (TWO) TIMES DAILY. USE IN EACH NOSTRIL AS DIRECTED 30 mL 1   carvedilol  (COREG ) 3.125 MG tablet TAKE 1 TABLET BY MOUTH TWICE A DAY WITH A MEAL 180 tablet 1   fluticasone  (FLONASE ) 50 MCG/ACT nasal spray Place 2 sprays into both nostrils daily. 48 mL 1   furosemide  (LASIX ) 40 MG tablet Take 1 tablet (40 mg total) by mouth daily. 90 tablet 3   hydrochlorothiazide  (HYDRODIURIL ) 25 MG tablet Take 1 tablet (25 mg total) by mouth daily. (Patient taking differently: Take 25 mg by mouth every evening.) 90 tablet 3   ibuprofen  (ADVIL ) 200 MG tablet Take 800 mg by mouth every 8 (eight) hours as needed for mild pain (pain score 1-3) or moderate pain (pain score 4-6).     losartan  (COZAAR ) 100 MG tablet TAKE 1 TABLET BY MOUTH EVERY DAY 90 tablet 1   metFORMIN  (GLUCOPHAGE -XR) 500 MG 24 hr tablet Take 1 tablet (500 mg total) by mouth daily with breakfast. (Patient taking differently: Take 500 mg by mouth daily with supper.) 90 tablet 3   Multiple Vitamins-Minerals (MULTIVITAMIN MEN 50+) TABS Take 1 tablet by mouth every evening.     mupirocin  ointment (BACTROBAN ) 2 % Apply 1 Application topically daily. 22 g 0  nutrition supplement, JUVEN, (JUVEN) PACK Take 1 packet by mouth 2 (two) times daily between meals. (Patient not taking: Reported on 02/15/2024) 30 packet 0   Omega-3 Fatty Acids (FISH OIL) 1000 MG CAPS Take 1,000 mg by mouth every evening.     potassium chloride  SA (KLOR-CON  M) 20 MEQ tablet Take 1 tablet (20 mEq total) by mouth 2 (two) times daily. 180 tablet 0   pravastatin  (PRAVACHOL ) 10 MG tablet Take 1 tablet (10 mg total) by mouth daily. (Patient taking differently: Take 10 mg by mouth every evening.) 90 tablet 3   predniSONE  (DELTASONE ) 20 MG tablet TAKE 2 TABLETS BY MOUTH EVERY DAY FOR 5 DAYS (Patient not taking: Reported on 02/15/2024)     spironolactone  (ALDACTONE ) 50 MG tablet Take 1 tablet (50 mg total) by mouth  daily. (Patient taking differently: Take 50 mg by mouth every evening.) 90 tablet 3   No current facility-administered medications for this visit.    Review of Systems  Constitutional:  Constitutional negative. HENT: HENT negative.  Eyes: Eyes negative.  Respiratory: Respiratory negative.  Cardiovascular: Cardiovascular negative.  GI: Gastrointestinal negative.  Musculoskeletal: Musculoskeletal negative.  Skin: Positive for wound.  Neurological: Neurological negative. Hematologic: Hematologic/lymphatic negative.  Psychiatric: Psychiatric negative.        Objective:  Objective  Vitals:   03/23/24 1216  BP: (!) 168/98  Pulse: 90  Temp: 98.1 F (36.7 C)  SpO2: 95%      Physical Exam Constitutional:      Appearance: He is obese.  HENT:     Head: Normocephalic.     Nose: Nose normal.     Mouth/Throat:     Mouth: Mucous membranes are moist.  Cardiovascular:     Rate and Rhythm: Normal rate.     Pulses:          Dorsalis pedis pulses are 2+ on the right side.  Pulmonary:     Effort: Pulmonary effort is normal.  Abdominal:     General: Abdomen is flat.  Musculoskeletal:        General: Normal range of motion.     Cervical back: Normal range of motion and neck supple.     Right lower leg: Edema present.     Left lower leg: Edema present.     Comments: Appears to be healed right small toe amputation Skin changes bilateral lower extremity consistent with C4 venous disease with lipo dermatosclerosis  Skin:    General: Skin is warm.     Capillary Refill: Capillary refill takes less than 2 seconds.  Neurological:     General: No focal deficit present.     Mental Status: He is alert.     Data: ABI Findings:  +---------+------------------+-----+---------+--------+  Right   Rt Pressure (mmHg)IndexWaveform Comment   +---------+------------------+-----+---------+--------+  Brachial 185                                        +---------+------------------+-----+---------+--------+  PTA     165               0.87 triphasic          +---------+------------------+-----+---------+--------+  DP      150               0.79 triphasic          +---------+------------------+-----+---------+--------+  [place] Clore  0.74                    +---------+------------------+-----+---------+--------+   +---------+------------------+-----+---------+-------+  Left    Lt Pressure (mmHg)IndexWaveform Comment  +---------+------------------+-----+---------+-------+  Brachial 190                                      +---------+------------------+-----+---------+-------+  PTA     167               0.88 triphasic         +---------+------------------+-----+---------+-------+  DP      148               0.78 triphasic         +---------+------------------+-----+---------+-------+  Great Toe136               0.72                   +---------+------------------+-----+---------+-------+   +-------+-----------+-----------+------------+------------+  ABI/TBIToday's ABIToday's TBIPrevious ABIPrevious TBI  +-------+-----------+-----------+------------+------------+  Right 0.87       0.74                                 +-------+-----------+-----------+------------+------------+  Left  0.88       0.72                                 +-------+-----------+-----------+------------+------------+         Summary:  Right: Resting right ankle-brachial index indicates mild right lower  extremity arterial disease. The right toe-brachial index is normal.    Left: Resting left ankle-brachial index indicates mild left lower  extremity arterial disease. The left toe-brachial index is normal.      Assessment/Plan:    48 year old male here with concern for decreased arterial blood flow with now healing right small toe amputation.  He has a palpable dorsalis pedis  pulse on the right side which is the site of concern.  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  He would benefit from significant weight loss and consideration of compression stockings however unlikely to help without considerable change in weight.  All of his questions were answered he can see me on an as-needed basis.     Penne Lonni Colorado MD [facility]

## 2024-03-30 ENCOUNTER — Ambulatory Visit: Admitting: Family Medicine

## 2024-04-01 ENCOUNTER — Ambulatory Visit
Admission: RE | Admit: 2024-04-01 | Discharge: 2024-04-01 | Disposition: A | Discharge status: Home / Self Care | Source: Ambulatory Visit | Attending: Family Medicine | Admitting: Family Medicine

## 2024-04-01 ENCOUNTER — Encounter: Payer: Self-pay | Admitting: Family Medicine

## 2024-04-01 ENCOUNTER — Ambulatory Visit: Admitting: Family Medicine

## 2024-04-01 VITALS — BP 123/88 | HR 91 | Temp 97.0°F | Resp 18 | Wt >= 6400 oz

## 2024-04-01 DIAGNOSIS — Z89421 Acquired absence of other right toe(s): Secondary | ICD-10-CM

## 2024-04-01 DIAGNOSIS — I10 Essential (primary) hypertension: Secondary | ICD-10-CM

## 2024-04-01 DIAGNOSIS — L89892 Pressure ulcer of other site, stage 2: Secondary | ICD-10-CM

## 2024-04-01 DIAGNOSIS — R600 Localized edema: Secondary | ICD-10-CM | POA: Diagnosis not present

## 2024-04-01 DIAGNOSIS — J4521 Mild intermittent asthma with (acute) exacerbation: Secondary | ICD-10-CM | POA: Diagnosis not present

## 2024-04-01 DIAGNOSIS — B351 Tinea unguium: Secondary | ICD-10-CM

## 2024-04-01 MED ORDER — AMOXICILLIN-POT CLAVULANATE 875-125 MG PO TABS: 1.0000 | ORAL_TABLET | Freq: Two times a day (BID) | ORAL | 0 refills | Status: AC

## 2024-04-01 MED ORDER — ALBUTEROL SULFATE HFA 108 (90 BASE) MCG/ACT IN AERS: 2.0000 | INHALATION_SPRAY | RESPIRATORY_TRACT | 0 refills | Status: DC | PRN

## 2024-04-01 MED ORDER — HYDROCODONE BIT-HOMATROP MBR 5-1.5 MG/5ML PO SOLN: 5.0000 mL | Freq: Three times a day (TID) | ORAL | 0 refills | Status: AC | PRN

## 2024-04-01 MED ORDER — PREDNISONE 50 MG PO TABS: ORAL_TABLET | ORAL | 0 refills | Status: AC

## 2024-04-01 MED ORDER — DOXYCYCLINE HYCLATE 100 MG PO TABS: 100.0000 mg | ORAL_TABLET | Freq: Two times a day (BID) | ORAL | 0 refills | Status: DC

## 2024-04-01 MED ORDER — DOXYCYCLINE HYCLATE 100 MG PO TABS: 100.0000 mg | ORAL_TABLET | Freq: Two times a day (BID) | ORAL | 0 refills | Status: AC

## 2024-04-01 NOTE — Progress Notes (Signed)
 Assessment & Plan   Assessment/Plan:    Assessment and Plan Assessment & Plan Hypertension Controlled with current medication regimen. Blood pressure reading is 123/88 mmHg. - Continue current antihypertensive medications: furosemide  40 mg daily, hydrochlorothiazide  25 mg daily, losartan  100 mg daily, and spironolactone  50 mg daily.  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 Vascular surgery recommended weight management as the primary intervention. - Continue weight management strategies as recommended by vascular surgery.  Right foot post-amputation wound care of 5th toe and pressure ulcer on 4th toe Post-amputation wound on the right 5th toe with a small pressure ulcer on dorsal distal 4th toe likely due to tight shoe fit. No signs of infection, but stinging pain reported. Recent right fifth toe amputation for osteomyelitis. Orthopedics noted good healing progress with sutures removed and weight bearing tolerated. - Ordered x-ray of the right foot to rule out osteomyelitis. - Apply mupirocin  to the pressure ulcer. - Referred to podiatry for nail care and prescription shoes with a wider toe box. - Scheduled follow-up in one week to monitor wound healing.  Onychodystrophy and nail care Onychodystrophy possibly related to swelling and pressure from shoes. No prior podiatry consultation for nail management. - Referred to podiatry for nail care and management.  Acute cough with wheezing (reactive airway disease) Acute cough with wheezing for the past two weeks, likely reactive airway disease. No h/o asthma. Denies chest pain, shortness of breath, or fever. Wheezing noted in the right lower lung. Recent exposure to cold from children. Nebulizer use provides relief from wheezing. - Ordered chest x-ray to evaluate for respiratory issues. - Prescribed Augmentin  875 mg twice daily for  10 days for potential respiratory infection. - Prescribed doxycycline  100 mg BID for 10 DAYS to cover for atypical respiratory pathogens. - Prescribed albuterol inhaler, two puffs every four hours as needed for wheezing. - Prescribed hycodan for cough, five mL every eight hours as needed. - Prescribed prednisone  50 mg daily for 5 days for respiratory wheeze      Medications Discontinued During This Encounter  Medication Reason   predniSONE  (DELTASONE ) 20 MG tablet    amoxicillin -clavulanate (AUGMENTIN ) 875-125 MG tablet Reorder   doxycycline  (VIBRA -TABS) 100 MG tablet     Return in about 1 week (around 04/08/2024) for toe ulcer.        Subjective:   Encounter date: 04/01/2024  Tyler Carroll is a 48 y.o. male who has Peripheral edema; Essential hypertension; Chronic low back pain without sciatica; Chronic pain of right knee; Super obesity; Vitamin D  deficiency; Prediabetes; Hyperlipidemia; Statin intolerance; OSA (obstructive sleep apnea); Acute osteomyelitis of toe of right foot (HCC); Lymphedema of both lower extremities; and Amputation of toe of right foot on their problem list..   He  has a past medical history of Anemia (12/09/2022), Dental caries (01/05/2023), Frequency of urination and polyuria (01/05/2023), Hypertension, Hypokalemia (11/03/2022), Lumbago, Pre-diabetes, and Sleep apnea.SABRA   He presents with chief complaint of Hypertension (1 month follow up HTN. Pt does not monitor blood pressure at home //HM due-vaccinations and colonoscopy ) and Foot Injury (Pt c/o of right foot discomfort after pinky amputation ) .   Discussed the use of AI scribe software for clinical note transcription with the patient, who gave verbal consent to proceed.  History of Present Illness Tyler Carroll is a 47 year old male with hypertension and severe obesity who presents for follow-up on blood pressure  management.  Hypertension - Blood pressure is well controlled at 123/88  mmHg. - Current antihypertensive regimen includes furosemide  40 mg daily, hydrochlorothiazide  25 mg daily, losartan  100 mg daily, and spironolactone  50 mg daily.  Severe obesity - Body mass index (BMI) is 67.  Postoperative status - right fifth toe amputation - Underwent right fifth toe amputation due to osteomyelitis approximately one month ago. - Sutures have been removed. - Follows with vascular surgery and orthopedics for postoperative care.  Right fourth toe pain and suspected pressure ulcer - New stinging pain on the dorsal aspect of the right fourth toe, onset earlier this week. - Uses ibuprofen  for pain relief and mupirocin  for topical care. - Suspects a pressure ulcer due to tight shoes, despite wearing larger shoes. - No podiatry evaluation to date.  Respiratory symptoms and sleep apnea - Follows with pulmonology for sleep apnea and is awaiting a home study to resume BiPAP therapy. - Cough and wheezing for the past two weeks. - No chest pain, shortness of breath, fever, or chills. - Uses a nebulizer at home, which provides relief from wheezing; used two to three times this week. - No history of asthma. - Albuterol provides relief from wheezing. - Recent exposure to children with upper respiratory infections.      ROS  Past Surgical History:  Procedure Laterality Date   AMPUTATION TOE Right 01/24/2024   Procedure: AMPUTATION, TOE;  Surgeon: Barton Drape, MD;  Location: [facility] OR;  Service: Orthopedics;  Laterality: Right;  RIGHT 5TH TOE   WISDOM TOOTH EXTRACTION Bilateral     Current Outpatient Medications on File Prior to Visit  Medication Sig Dispense Refill   acetaminophen  (TYLENOL ) 500 MG tablet Take 1,000 mg by mouth every 6 (six) hours as needed for mild pain (pain score 1-3) or moderate pain (pain score 4-6).     Azelastine  HCl 137 MCG/SPRAY SOLN PLACE 2 SPRAYS INTO BOTH NOSTRILS 2 (TWO) TIMES DAILY. USE IN EACH NOSTRIL AS DIRECTED 30 mL 1   carvedilol   (COREG ) 3.125 MG tablet TAKE 1 TABLET BY MOUTH TWICE A DAY WITH A MEAL 180 tablet 1   fluticasone  (FLONASE ) 50 MCG/ACT nasal spray Place 2 sprays into both nostrils daily. 48 mL 1   furosemide  (LASIX ) 40 MG tablet Take 1 tablet (40 mg total) by mouth daily. 90 tablet 3   hydrochlorothiazide  (HYDRODIURIL ) 25 MG tablet Take 1 tablet (25 mg total) by mouth daily. (Patient taking differently: Take 25 mg by mouth every evening.) 90 tablet 3   ibuprofen  (ADVIL ) 200 MG tablet Take 800 mg by mouth every 8 (eight) hours as needed for mild pain (pain score 1-3) or moderate pain (pain score 4-6).     losartan  (COZAAR ) 100 MG tablet TAKE 1 TABLET BY MOUTH EVERY DAY 90 tablet 1   metFORMIN  (GLUCOPHAGE -XR) 500 MG 24 hr tablet Take 1 tablet (500 mg total) by mouth daily with breakfast. (Patient taking differently: Take 500 mg by mouth daily with supper.) 90 tablet 3   Multiple Vitamins-Minerals (MULTIVITAMIN MEN 50+) TABS Take 1 tablet by mouth every evening.     mupirocin  ointment (BACTROBAN ) 2 % Apply 1 Application topically daily. 22 g 0   Omega-3 Fatty Acids (FISH OIL) 1000 MG CAPS Take 1,000 mg by mouth every evening.     spironolactone  (ALDACTONE ) 50 MG tablet Take 1 tablet (50 mg total) by mouth daily. (Patient taking differently: Take 50 mg by mouth every evening.) 90 tablet 3   aspirin  EC 81 MG tablet  Take 1 tablet (81 mg total) by mouth daily. Swallow whole. (Patient not taking: Reported on 04/01/2024) 90 tablet 3   nutrition supplement, JUVEN, (JUVEN) PACK Take 1 packet by mouth 2 (two) times daily between meals. (Patient not taking: Reported on 02/15/2024) 30 packet 0   potassium chloride  SA (KLOR-CON  M) 20 MEQ tablet Take 1 tablet (20 mEq total) by mouth 2 (two) times daily. 180 tablet 0   pravastatin  (PRAVACHOL ) 10 MG tablet Take 1 tablet (10 mg total) by mouth daily. (Patient taking differently: Take 10 mg by mouth every evening.) 90 tablet 3   [DISCONTINUED] potassium chloride  (KLOR-CON ) 10 MEQ tablet  Take by mouth.     [DISCONTINUED] rosuvastatin  (CRESTOR ) 10 MG tablet Take 1 tablet (10 mg total) by mouth daily. 30 tablet 1   No current facility-administered medications on file prior to visit.    Family History  Problem Relation Age of Onset   Hypertension Mother    Heart failure Father     Social History   Socioeconomic History   Marital status: Single    Spouse name: Not on file   Number of children: Not on file   Years of education: Not on file   Highest education level: Not on file  Occupational History   Not on file  Tobacco Use   Smoking status: Never    Passive exposure: Never   Smokeless tobacco: Never  Vaping Use   Vaping status: Never Used  Substance and Sexual Activity   Alcohol use: Not Currently   Drug use: Never   Sexual activity: Yes    Birth control/protection: None    Comment: Married  Other Topics Concern   Not on file  Social History Narrative   Not on file   Social Drivers of Health   Financial Resource Strain: Not on file  Food Insecurity: No Food Insecurity (01/24/2024)   Hunger Vital Sign    Worried About Running Out of Food in the Last Year: Never true    Ran Out of Food in the Last Year: Never true  Transportation Needs: No Transportation Needs (01/24/2024)   PRAPARE - Administrator, Civil Service (Medical): No    Lack of Transportation (Non-Medical): No  Physical Activity: Not on file  Stress: Not on file  Social Connections: Not on file  Intimate Partner Violence: Not At Risk (01/24/2024)   Humiliation, Afraid, Rape, and Kick questionnaire    Fear of Current or Ex-Partner: No    Emotionally Abused: No    Physically Abused: No    Sexually Abused: No                                                                                                  Objective:  Physical Exam: BP 123/88 (BP Location: Right Arm, Patient Position: Sitting, Cuff Size: Large) Comment: recheck  Pulse 91   Temp (!) 97 F (36.1 C)  (Temporal)   Resp 18   Wt (!) 495 lb (224.5 kg)   SpO2 96%   BMI 67.13 kg/m    Physical Exam VITALS: BP- 123/88  MEASUREMENTS: BMI- 67.0. GENERAL: Alert, cooperative, well developed, no acute distress. HEENT: Normocephalic, normal oropharynx, moist mucous membranes. CHEST: Wheezing in right lower lung. CARDIOVASCULAR: Normal heart rate and rhythm, S1 and S2 normal without murmurs. ABDOMEN: Soft, non-tender, non-distended, without organomegaly, normal bowel sounds. EXTREMITIES: 3+ non pitting edema lower extremities, Small pressure ulcer on right dorsal 4th toe, no drainage, onychodystrophy with onychomycosis and onychogryphosis NEUROLOGICAL: Cranial nerves grossly intact, moves all extremities without gross motor or sensory deficit.    Physical Exam   Recent Results (from the past 2160 hours)  CBC with Differential     Status: Abnormal   Collection Time: 01/23/24  2:28 PM  Result Value Ref Range   WBC 9.8 4.0 - 10.5 K/uL   RBC 4.77 4.22 - 5.81 MIL/uL   Hemoglobin 12.8 (L) 13.0 - 17.0 g/dL   HCT 59.9 60.9 - 47.9 %   MCV 83.9 80.0 - 100.0 fL   MCH 26.8 26.0 - 34.0 pg   MCHC 32.0 30.0 - 36.0 g/dL   RDW 85.7 88.4 - 84.4 %   Platelets 384 150 - 400 K/uL   nRBC 0.0 0.0 - 0.2 %   Neutrophils Relative % 64 %   Neutro Abs 6.3 1.7 - 7.7 K/uL   Lymphocytes Relative 25 %   Lymphs Abs 2.4 0.7 - 4.0 K/uL   Monocytes Relative 7 %   Monocytes Absolute 0.7 0.1 - 1.0 K/uL   Eosinophils Relative 4 %   Eosinophils Absolute 0.4 0.0 - 0.5 K/uL   Basophils Relative 0 %   Basophils Absolute 0.0 0.0 - 0.1 K/uL   Immature Granulocytes 0 %   Abs Immature Granulocytes 0.03 0.00 - 0.07 K/uL    Comment: Performed at [facility], [address] [place], [place] [address]  Comprehensive metabolic panel     Status: Abnormal   Collection Time: 01/23/24  2:28 PM  Result Value Ref Range   Sodium 135 135 - 145 mmol/L   Potassium 3.0 (L) 3.5 - 5.1 mmol/L   Chloride 97 (L) 98 - 111 mmol/L    CO2 27 22 - 32 mmol/L   Glucose, Bld 126 (H) 70 - 99 mg/dL    Comment: Glucose reference range applies only to samples taken after fasting for at least 8 hours.   BUN 13 6 - 20 mg/dL   Creatinine, Ser 9.03 0.61 - 1.24 mg/dL   Calcium  9.1 8.9 - 10.3 mg/dL   Total Protein 7.4 6.5 - 8.1 g/dL   Albumin 3.2 (L) 3.5 - 5.0 g/dL   AST 24 15 - 41 U/L   ALT 22 0 - 44 U/L   Alkaline Phosphatase 89 38 - 126 U/L   Total Bilirubin 0.4 0.0 - 1.2 mg/dL   GFR, Estimated >39 >39 mL/min    Comment: (NOTE) Calculated using the CKD-EPI Creatinine Equation (2021)    Anion gap 11 5 - 15    Comment: Performed at [facility], [address], [place], [place] [address]  Glucose, capillary     Status: Abnormal   Collection Time: 01/24/24 12:25 AM  Result Value Ref Range   Glucose-Capillary 128 (H) 70 - 99 mg/dL    Comment: Glucose reference range applies only to samples taken after fasting for at least 8 hours.  Glucose, capillary     Status: Abnormal   Collection Time: 01/24/24  7:46 AM  Result Value Ref Range   Glucose-Capillary 114 (H) 70 - 99 mg/dL  Comment: Glucose reference range applies only to samples taken after fasting for at least 8 hours.  Sedimentation rate     Status: Abnormal   Collection Time: 01/24/24  8:47 AM  Result Value Ref Range   Sed Rate 46 (H) 0 - 16 mm/hr    Comment: Performed at [facility], [address] [place], [place] [address]  C-reactive protein     Status: Abnormal   Collection Time: 01/24/24  8:47 AM  Result Value Ref Range   CRP 2.2 (H) <1.0 mg/dL    Comment: Performed at [facility], [address] [place], [place] [address]  Prealbumin     Status: None   Collection Time: 01/24/24  8:47 AM  Result Value Ref Range   Prealbumin 23 18 - 38 mg/dL    Comment: Performed at [facility], [address] [place], [place] [address]  HIV Antibody (routine testing w rflx)     Status: None   Collection Time: 01/24/24  8:47 AM  Result Value  Ref Range   HIV Screen 4th Generation wRfx Non Reactive Non Reactive    Comment: Performed at [facility], [address], [place], [place] [address]  Hemoglobin A1c     Status: Abnormal   Collection Time: 01/24/24  8:47 AM  Result Value Ref Range   Hgb A1c MFr Bld 6.2 (H) 4.8 - 5.6 %    Comment: (NOTE) Diagnosis of Diabetes The following HbA1c ranges recommended by the American Diabetes Association (ADA) may be used as an aid in the diagnosis of diabetes mellitus.  Hemoglobin             Suggested A1C NGSP%              Diagnosis  <5.7                   Non Diabetic  5.7-6.4                Pre-Diabetic  >6.4                   Diabetic  <7.0                   Glycemic control for                       adults with diabetes.     Mean Plasma Glucose 131.24 mg/dL    Comment: Performed at [facility], [address], [place], [place] [address]  CBC with Differential/Platelet     Status: Abnormal   Collection Time: 01/24/24  8:47 AM  Result Value Ref Range   WBC 7.9 4.0 - 10.5 K/uL   RBC 4.47 4.22 - 5.81 MIL/uL   Hemoglobin 11.8 (L) 13.0 - 17.0 g/dL   HCT 63.4 (L) 60.9 - 47.9 %   MCV 81.7 80.0 - 100.0 fL   MCH 26.4 26.0 - 34.0 pg   MCHC 32.3 30.0 - 36.0 g/dL   RDW 85.5 88.4 - 84.4 %   Platelets 333 150 - 400 K/uL   nRBC 0.0 0.0 - 0.2 %   Neutrophils Relative % 59 %   Neutro Abs 4.7 1.7 - 7.7 K/uL   Lymphocytes Relative 27 %   Lymphs Abs 2.1 0.7 - 4.0 K/uL   Monocytes Relative 9 %   Monocytes Absolute 0.7 0.1 - 1.0 K/uL   Eosinophils Relative 4 %  Eosinophils Absolute 0.3 0.0 - 0.5 K/uL   Basophils Relative 1 %   Basophils Absolute 0.0 0.0 - 0.1 K/uL   Immature Granulocytes 0 %   Abs Immature Granulocytes 0.03 0.00 - 0.07 K/uL    Comment: Performed at [facility], [address] [place], [place] [address]  Surgical pcr screen     Status: Abnormal   Collection Time: 01/24/24 10:28 AM   Specimen: Nasal Mucosa; Nasal Swab  Result Value Ref Range    MRSA, PCR POSITIVE (A) NEGATIVE    Comment: RESULT CALLED TO, READ BACK BY AND VERIFIED WITH: RN LUKE MEISSNER [id number] AT 1219 BY EC    Staphylococcus aureus POSITIVE (A) NEGATIVE    Comment: (NOTE) The Xpert SA Assay (FDA approved for NASAL specimens in patients 80 years of age and older), is one component of a comprehensive surveillance program. It is not intended to diagnose infection nor to guide or monitor treatment. Performed at [facility], [address], [place], [place] [address]   Glucose, capillary     Status: Abnormal   Collection Time: 01/24/24 11:53 AM  Result Value Ref Range   Glucose-Capillary 111 (H) 70 - 99 mg/dL    Comment: Glucose reference range applies only to samples taken after fasting for at least 8 hours.  Aerobic/Anaerobic Culture w Gram Stain (surgical/deep wound)     Status: None   Collection Time: 01/24/24  1:39 PM   Specimen: Wound; Tissue  Result Value Ref Range   Specimen Description WOUND    Special Requests RIGHT FIFTH TOE AMPUTATION    Gram Stain      RARE WBC PRESENT, PREDOMINANTLY PMN FEW GRAM POSITIVE COCCI    Culture      MODERATE METHICILLIN RESISTANT STAPHYLOCOCCUS AUREUS RARE ACINETOBACTER CALCOACETICUS/BAUMANNII COMPLEX NO ANAEROBES ISOLATED Performed at [facility], [address], [place], [place] [address]    Report Status 01/29/2024 FINAL    Organism ID, Bacteria METHICILLIN RESISTANT STAPHYLOCOCCUS AUREUS    Organism ID, Bacteria ACINETOBACTER CALCOACETICUS/BAUMANNII COMPLEX       Susceptibility   Acinetobacter calcoaceticus/baumannii complex - MIC*    MINOCYCLINE <=0.5 SENSITIVE Sensitive     IMIPENEM <=0.5 SENSITIVE Sensitive     PIP/TAZO Value in next row Sensitive ug/mL     <=4 SENSITIVEThis is a modified FDA-approved test that has been validated and its performance characteristics determined by the reporting laboratory.  This laboratory is certified under the Clinical Laboratory Improvement Amendments  CLIA as qualified to perform high complexity clinical laboratory testing.    AMPICILLIN/SULBACTAM Value in next row Sensitive      <=4 SENSITIVEThis is a modified FDA-approved test that has been validated and its performance characteristics determined by the reporting laboratory.  This laboratory is certified under the Clinical Laboratory Improvement Amendments CLIA as qualified to perform high complexity clinical laboratory testing.    MEROPENEM Value in next row Sensitive      <=4 SENSITIVEThis is a modified FDA-approved test that has been validated and its performance characteristics determined by the reporting laboratory.  This laboratory is certified under the Clinical Laboratory Improvement Amendments CLIA as qualified to perform high complexity clinical laboratory testing.    * RARE ACINETOBACTER CALCOACETICUS/BAUMANNII COMPLEX   Methicillin resistant staphylococcus aureus - MIC*    CIPROFLOXACIN Value in next row Sensitive      <=4 SENSITIVEThis is a modified FDA-approved test that has been validated and its performance characteristics determined by the reporting laboratory.  This  laboratory is certified under the Clinical Laboratory Improvement Amendments CLIA as qualified to perform high complexity clinical laboratory testing.    ERYTHROMYCIN Value in next row Resistant      <=4 SENSITIVEThis is a modified FDA-approved test that has been validated and its performance characteristics determined by the reporting laboratory.  This laboratory is certified under the Clinical Laboratory Improvement Amendments CLIA as qualified to perform high complexity clinical laboratory testing.    GENTAMICIN Value in next row Sensitive      <=4 SENSITIVEThis is a modified FDA-approved test that has been validated and its performance characteristics determined by the reporting laboratory.  This laboratory is certified under the Clinical Laboratory Improvement Amendments CLIA as qualified to perform high complexity  clinical laboratory testing.    OXACILLIN Value in next row Resistant      <=4 SENSITIVEThis is a modified FDA-approved test that has been validated and its performance characteristics determined by the reporting laboratory.  This laboratory is certified under the Clinical Laboratory Improvement Amendments CLIA as qualified to perform high complexity clinical laboratory testing.    TETRACYCLINE Value in next row Sensitive      <=4 SENSITIVEThis is a modified FDA-approved test that has been validated and its performance characteristics determined by the reporting laboratory.  This laboratory is certified under the Clinical Laboratory Improvement Amendments CLIA as qualified to perform high complexity clinical laboratory testing.    VANCOMYCIN  Value in next row Sensitive      <=4 SENSITIVEThis is a modified FDA-approved test that has been validated and its performance characteristics determined by the reporting laboratory.  This laboratory is certified under the Clinical Laboratory Improvement Amendments CLIA as qualified to perform high complexity clinical laboratory testing.    TRIMETH/SULFA Value in next row Sensitive      <=4 SENSITIVEThis is a modified FDA-approved test that has been validated and its performance characteristics determined by the reporting laboratory.  This laboratory is certified under the Clinical Laboratory Improvement Amendments CLIA as qualified to perform high complexity clinical laboratory testing.    CLINDAMYCIN Value in next row Sensitive      <=4 SENSITIVEThis is a modified FDA-approved test that has been validated and its performance characteristics determined by the reporting laboratory.  This laboratory is certified under the Clinical Laboratory Improvement Amendments CLIA as qualified to perform high complexity clinical laboratory testing.    RIFAMPIN Value in next row Sensitive      <=4 SENSITIVEThis is a modified FDA-approved test that has been validated and its  performance characteristics determined by the reporting laboratory.  This laboratory is certified under the Clinical Laboratory Improvement Amendments CLIA as qualified to perform high complexity clinical laboratory testing.    Inducible Clindamycin Value in next row Sensitive      <=4 SENSITIVEThis is a modified FDA-approved test that has been validated and its performance characteristics determined by the reporting laboratory.  This laboratory is certified under the Clinical Laboratory Improvement Amendments CLIA as qualified to perform high complexity clinical laboratory testing.    LINEZOLID Value in next row Sensitive      <=4 SENSITIVEThis is a modified FDA-approved test that has been validated and its performance characteristics determined by the reporting laboratory.  This laboratory is certified under the Clinical Laboratory Improvement Amendments CLIA as qualified to perform high complexity clinical laboratory testing.    * MODERATE METHICILLIN RESISTANT STAPHYLOCOCCUS AUREUS  Aerobic/Anaerobic Culture w Gram Stain (surgical/deep wound)     Status: None   Collection Time: 01/24/24  1:51 PM   Specimen: PATH Digit amputation; Tissue  Result Value Ref Range   Specimen Description TISSUE    Special Requests RIGHT FIFTH TOE AMPUTATION,PATH DIGIT AMPUTATION    Gram Stain      RARE WBC PRESENT, PREDOMINANTLY PMN RARE GRAM POSITIVE COCCI    Culture      FEW STAPHYLOCOCCUS AUREUS SUSCEPTIBILITIES PERFORMED ON PREVIOUS CULTURE WITHIN THE LAST 5 DAYS. RARE KLEBSIELLA AEROGENES RARE STAPHYLOCOCCUS EPIDERMIDIS NO ANAEROBES ISOLATED Performed at [facility], [address] [place], [place] [address]    Report Status 01/29/2024 FINAL    Organism ID, Bacteria KLEBSIELLA AEROGENES    Organism ID, Bacteria STAPHYLOCOCCUS EPIDERMIDIS       Susceptibility   Klebsiella aerogenes - MIC*    CEFEPIME 2 SENSITIVE Sensitive     ERTAPENEM 1 INTERMEDIATE Intermediate     CEFTRIAXONE  8 RESISTANT  Resistant     CIPROFLOXACIN <=0.06 SENSITIVE Sensitive     GENTAMICIN <=1 SENSITIVE Sensitive     MEROPENEM <=0.25 SENSITIVE Sensitive     TRIMETH/SULFA <=20 SENSITIVE Sensitive     PIP/TAZO Value in next row Sensitive ug/mL     <=4 SENSITIVEThis is a modified FDA-approved test that has been validated and its performance characteristics determined by the reporting laboratory.  This laboratory is certified under the Clinical Laboratory Improvement Amendments CLIA as qualified to perform high complexity clinical laboratory testing.    * RARE KLEBSIELLA AEROGENES   Staphylococcus epidermidis - MIC*    CIPROFLOXACIN Value in next row Sensitive      <=4 SENSITIVEThis is a modified FDA-approved test that has been validated and its performance characteristics determined by the reporting laboratory.  This laboratory is certified under the Clinical Laboratory Improvement Amendments CLIA as qualified to perform high complexity clinical laboratory testing.    ERYTHROMYCIN Value in next row Resistant      <=4 SENSITIVEThis is a modified FDA-approved test that has been validated and its performance characteristics determined by the reporting laboratory.  This laboratory is certified under the Clinical Laboratory Improvement Amendments CLIA as qualified to perform high complexity clinical laboratory testing.    GENTAMICIN Value in next row Sensitive      <=4 SENSITIVEThis is a modified FDA-approved test that has been validated and its performance characteristics determined by the reporting laboratory.  This laboratory is certified under the Clinical Laboratory Improvement Amendments CLIA as qualified to perform high complexity clinical laboratory testing.    OXACILLIN Value in next row Resistant      <=4 SENSITIVEThis is a modified FDA-approved test that has been validated and its performance characteristics determined by the reporting laboratory.  This laboratory is certified under the Clinical Laboratory  Improvement Amendments CLIA as qualified to perform high complexity clinical laboratory testing.    TETRACYCLINE Value in next row Resistant      <=4 SENSITIVEThis is a modified FDA-approved test that has been validated and its performance characteristics determined by the reporting laboratory.  This laboratory is certified under the Clinical Laboratory Improvement Amendments CLIA as qualified to perform high complexity clinical laboratory testing.    VANCOMYCIN  Value in next row Sensitive      <=4 SENSITIVEThis is a modified FDA-approved test that has been validated and its performance characteristics determined by the reporting laboratory.  This laboratory is certified under the Clinical Laboratory Improvement Amendments CLIA as qualified to perform high complexity clinical laboratory testing.    TRIMETH/SULFA Value in next row Resistant      <=4  SENSITIVEThis is a modified FDA-approved test that has been validated and its performance characteristics determined by the reporting laboratory.  This laboratory is certified under the Clinical Laboratory Improvement Amendments CLIA as qualified to perform high complexity clinical laboratory testing.    CLINDAMYCIN Value in next row Sensitive      <=4 SENSITIVEThis is a modified FDA-approved test that has been validated and its performance characteristics determined by the reporting laboratory.  This laboratory is certified under the Clinical Laboratory Improvement Amendments CLIA as qualified to perform high complexity clinical laboratory testing.    RIFAMPIN Value in next row Sensitive      <=4 SENSITIVEThis is a modified FDA-approved test that has been validated and its performance characteristics determined by the reporting laboratory.  This laboratory is certified under the Clinical Laboratory Improvement Amendments CLIA as qualified to perform high complexity clinical laboratory testing.    Inducible Clindamycin Value in next row Sensitive      <=4  SENSITIVEThis is a modified FDA-approved test that has been validated and its performance characteristics determined by the reporting laboratory.  This laboratory is certified under the Clinical Laboratory Improvement Amendments CLIA as qualified to perform high complexity clinical laboratory testing.    * RARE STAPHYLOCOCCUS EPIDERMIDIS  Glucose, capillary     Status: Abnormal   Collection Time: 01/24/24  5:57 PM  Result Value Ref Range   Glucose-Capillary 143 (H) 70 - 99 mg/dL    Comment: Glucose reference range applies only to samples taken after fasting for at least 8 hours.  Glucose, capillary     Status: Abnormal   Collection Time: 01/24/24  8:57 PM  Result Value Ref Range   Glucose-Capillary 162 (H) 70 - 99 mg/dL    Comment: Glucose reference range applies only to samples taken after fasting for at least 8 hours.  Basic metabolic panel with GFR     Status: Abnormal   Collection Time: 01/24/24  9:27 PM  Result Value Ref Range   Sodium 137 135 - 145 mmol/L   Potassium 3.6 3.5 - 5.1 mmol/L   Chloride 101 98 - 111 mmol/L   CO2 26 22 - 32 mmol/L   Glucose, Bld 134 (H) 70 - 99 mg/dL    Comment: Glucose reference range applies only to samples taken after fasting for at least 8 hours.   BUN 9 6 - 20 mg/dL   Creatinine, Ser 9.13 0.61 - 1.24 mg/dL   Calcium  8.5 (L) 8.9 - 10.3 mg/dL   GFR, Estimated >39 >39 mL/min    Comment: (NOTE) Calculated using the CKD-EPI Creatinine Equation (2021)    Anion gap 10 5 - 15    Comment: Performed at [facility], [address] [place], [place] [address]  CBC     Status: Abnormal   Collection Time: 01/25/24  5:25 AM  Result Value Ref Range   WBC 7.5 4.0 - 10.5 K/uL   RBC 4.35 4.22 - 5.81 MIL/uL   Hemoglobin 11.5 (L) 13.0 - 17.0 g/dL   HCT 62.8 (L) 60.9 - 47.9 %   MCV 85.3 80.0 - 100.0 fL   MCH 26.4 26.0 - 34.0 pg   MCHC 31.0 30.0 - 36.0 g/dL   RDW 85.3 88.4 - 84.4 %   Platelets 325 150 - 400 K/uL   nRBC 0.0 0.0 - 0.2 %    Comment:  Performed at [facility], [address], [place], [place] [address]  Basic metabolic panel with GFR  Status: Abnormal   Collection Time: 01/25/24  5:25 AM  Result Value Ref Range   Sodium 135 135 - 145 mmol/L   Potassium 3.8 3.5 - 5.1 mmol/L   Chloride 105 98 - 111 mmol/L   CO2 27 22 - 32 mmol/L   Glucose, Bld 110 (H) 70 - 99 mg/dL    Comment: Glucose reference range applies only to samples taken after fasting for at least 8 hours.   BUN 10 6 - 20 mg/dL   Creatinine, Ser 9.15 0.61 - 1.24 mg/dL   Calcium  8.4 (L) 8.9 - 10.3 mg/dL   GFR, Estimated >39 >39 mL/min    Comment: (NOTE) Calculated using the CKD-EPI Creatinine Equation (2021)    Anion gap 3 (L) 5 - 15    Comment: Performed at [facility], [address], [place], [place] [address]  Glucose, capillary     Status: Abnormal   Collection Time: 01/25/24  7:51 AM  Result Value Ref Range   Glucose-Capillary 114 (H) 70 - 99 mg/dL    Comment: Glucose reference range applies only to samples taken after fasting for at least 8 hours.  Glucose, capillary     Status: Abnormal   Collection Time: 01/25/24 11:16 AM  Result Value Ref Range   Glucose-Capillary 162 (H) 70 - 99 mg/dL    Comment: Glucose reference range applies only to samples taken after fasting for at least 8 hours.  Glucose, capillary     Status: Abnormal   Collection Time: 01/25/24  4:32 PM  Result Value Ref Range   Glucose-Capillary 120 (H) 70 - 99 mg/dL    Comment: Glucose reference range applies only to samples taken after fasting for at least 8 hours.  CBC w/Diff     Status: Abnormal   Collection Time: 02/03/24  2:05 PM  Result Value Ref Range   WBC 10.8 (H) 4.0 - 10.5 K/uL   RBC 5.05 4.22 - 5.81 Mil/uL   Hemoglobin 13.5 13.0 - 17.0 g/dL   HCT 59.1 60.9 - 47.9 %   MCV 80.7 78.0 - 100.0 fl   MCHC 33.0 30.0 - 36.0 g/dL   RDW 84.7 88.4 - 84.4 %   Platelets 402.0 (H) 150.0 - 400.0 K/uL   Neutrophils Relative % 64.6 43.0 - 77.0 %   Lymphocytes  Relative 24.5 12.0 - 46.0 %   Monocytes Relative 6.9 3.0 - 12.0 %   Eosinophils Relative 3.3 0.0 - 5.0 %   Basophils Relative 0.7 0.0 - 3.0 %   Neutro Abs 6.9 1.4 - 7.7 K/uL   Lymphs Abs 2.6 0.7 - 4.0 K/uL   Monocytes Absolute 0.7 0.1 - 1.0 K/uL   Eosinophils Absolute 0.4 0.0 - 0.7 K/uL   Basophils Absolute 0.1 0.0 - 0.1 K/uL  Comp Met (CMET)     Status: Abnormal   Collection Time: 02/03/24  2:05 PM  Result Value Ref Range   Sodium 136 135 - 145 mEq/L   Potassium 3.5 3.5 - 5.1 mEq/L   Chloride 97 96 - 112 mEq/L   CO2 29 19 - 32 mEq/L   Glucose, Bld 107 (H) 70 - 99 mg/dL   BUN 16 6 - 23 mg/dL   Creatinine, Ser 9.19 0.40 - 1.50 mg/dL   Total Bilirubin 0.4 0.2 - 1.2 mg/dL   Alkaline Phosphatase 87 39 - 117 U/L   AST 17 0 - 37 U/L   ALT 23 0 - 53 U/L   Total Protein 7.9 6.0 -  8.3 g/dL   Albumin 3.9 3.5 - 5.2 g/dL   GFR 896.56 >39.99 mL/min    Comment: Calculated using the CKD-EPI Creatinine Equation (2021)   Calcium  8.9 8.4 - 10.5 mg/dL  C-reactive protein     Status: None   Collection Time: 02/03/24  2:05 PM  Result Value Ref Range   CRP 1.6 0.5 - 20.0 mg/dL  VAS US  ABI WITH/WO TBI     Status: None   Collection Time: 02/10/24  1:53 PM  Result Value Ref Range   Right ABI 0.87    Left ABI 0.88         Beverley KATHEE Hummer, MD  I,Emily Lagle,acting as a scribe for Beverley KATHEE Hummer, MD.,have documented all relevant documentation on the behalf of Beverley KATHEE Hummer, MD,as directed by  Beverley KATHEE Hummer, MD while in the presence of Beverley KATHEE Hummer, MD.   I, Beverley KATHEE Hummer, MD, have reviewed all documentation for this visit. The documentation on 04/01/2024 for the exam, diagnosis, procedures, and orders are all accurate and complete.

## 2024-04-01 NOTE — Patient Instructions (Addendum)
 It was very nice to see you today!  VISIT SUMMARY: You came in for a follow-up on your blood pressure management, which is well controlled. We also discussed your severe obesity, recent toe amputation, new toe pain, and respiratory symptoms.  YOUR PLAN: HYPERTENSION: Your blood pressure is well controlled with your current medications. -Continue taking your current medications: furosemide  40 mg daily, hydrochlorothiazide  25 mg daily, losartan  100 mg daily, and spironolactone  50 mg daily.  SEVERE OBESITY WITH LIPEDEMA AND LOWER EXTREMITY EDEMA: Your BMI is 67, which is contributing to lower extremity swelling and poor blood flow. -Continue with the weight management strategies recommended by vascular surgery.  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 -Get an x-ray of your right foot to rule out any bone infection. -Apply mupirocin  to the pressure ulcer. -Take Augmentin  875 mg twice daily for 10 days. -Take doxycycline  as prescribed for any atypical respiratory infections. -Follow up with podiatry for nail care and to get prescription shoes with a wider toe box. -Come back in one week to monitor your wound healing.  ONYCHODYSTROPHY AND NAIL CARE: You have nail changes possibly due to swelling and pressure from your shoes. -Follow up with podiatry for nail care and management.  ACUTE COUGH WITH WHEEZING (REACTIVE AIRWAY DISEASE): You have been experiencing a cough and wheezing for the past two weeks, likely due to reactive airway disease. -Get a chest x-ray to check for any respiratory issues. -Use your albuterol inhaler, two puffs every four hours as needed for wheezing. -Take hycodan for your cough, five mL every eight hours as needed. -We may consider steroids if your symptoms persist or worsen.  Return in about 1 week (around 04/08/2024) for toe ulcer.   Take  care, Arvella Hummer, MD, MS   PLEASE NOTE:  If you had any lab tests, please let us  know if you have not heard back within a few days. You may see your results on mychart before we have a chance to review them but we will give you a call once they are reviewed by us .   If we ordered any referrals today, please let us  know if you have not heard from their office within the next week.   If you had any urgent prescriptions sent in today, please check with the pharmacy within an hour of our visit to make sure the prescription was transmitted appropriately.   Please try these tips to maintain a healthy lifestyle:  Eat at least 3 REAL meals and 1-2 snacks per day.  Aim for no more than 5 hours between eating.  If you eat breakfast, please do so within one hour of getting up.   Each meal should contain half fruits/vegetables, one quarter protein, and one quarter carbs (no bigger than a computer mouse)  Cut down on sweet beverages. This includes juice, soda, and sweet tea.   Drink at least 1 glass of water with each meal and aim for at least 8 glasses per day  Exercise at least 150 minutes every week.

## 2024-04-03 ENCOUNTER — Ambulatory Visit: Payer: Self-pay | Admitting: Family Medicine

## 2024-04-05 ENCOUNTER — Encounter: Payer: Self-pay | Admitting: Family Medicine

## 2024-04-05 ENCOUNTER — Ambulatory Visit

## 2024-04-05 DIAGNOSIS — R0683 Snoring: Secondary | ICD-10-CM

## 2024-04-05 DIAGNOSIS — G4733 Obstructive sleep apnea (adult) (pediatric): Secondary | ICD-10-CM

## 2024-04-12 DIAGNOSIS — G4733 Obstructive sleep apnea (adult) (pediatric): Secondary | ICD-10-CM | POA: Diagnosis not present

## 2024-04-15 ENCOUNTER — Ambulatory Visit: Payer: Self-pay | Admitting: Adult Health

## 2024-04-15 NOTE — Progress Notes (Signed)
 ATC x1. LVMTCB

## 2024-04-15 NOTE — Progress Notes (Signed)
ATC x2.  LVM to return call.

## 2024-04-19 NOTE — Progress Notes (Signed)
 Called and spoke to pt. Advised of HST results per Tammy Parrett. Pt verbalized understanding. Pt now has an appt set for Friday, 04/22/24 at 1pm to review study. NFN.

## 2024-04-22 ENCOUNTER — Ambulatory Visit: Admitting: Podiatry

## 2024-04-22 ENCOUNTER — Telehealth (INDEPENDENT_AMBULATORY_CARE_PROVIDER_SITE_OTHER): Admitting: Adult Health

## 2024-04-22 ENCOUNTER — Encounter: Payer: Self-pay | Admitting: Adult Health

## 2024-04-22 DIAGNOSIS — G4733 Obstructive sleep apnea (adult) (pediatric): Secondary | ICD-10-CM | POA: Diagnosis not present

## 2024-04-22 NOTE — Patient Instructions (Addendum)
 Begin CPAP At bedtime, wear all night long  CPAP titration study-has been ordered.  Work on healthy weight loss.  Do not drive if sleepy  Follow up in 6-8 weeks and As needed
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
## 2024-05-07 ENCOUNTER — Other Ambulatory Visit: Payer: Self-pay | Admitting: Family Medicine

## 2024-05-07 DIAGNOSIS — J4521 Mild intermittent asthma with (acute) exacerbation: Secondary | ICD-10-CM

## 2024-07-05 ENCOUNTER — Ambulatory Visit (HOSPITAL_BASED_OUTPATIENT_CLINIC_OR_DEPARTMENT_OTHER): Admitting: Pulmonary Disease
